# Patient Record
Sex: Female | Born: 1980 | Hispanic: Yes | Marital: Married | Smoking: Never smoker
Health system: Southern US, Community
[De-identification: ages and names within clinical notes are randomized; demographics above are authoritative.]

## PROBLEM LIST (undated history)

## (undated) DIAGNOSIS — E785 Hyperlipidemia, unspecified: Secondary | ICD-10-CM

## (undated) DIAGNOSIS — D219 Benign neoplasm of connective and other soft tissue, unspecified: Secondary | ICD-10-CM

## (undated) DIAGNOSIS — F329 Major depressive disorder, single episode, unspecified: Secondary | ICD-10-CM

## (undated) DIAGNOSIS — F32A Depression, unspecified: Secondary | ICD-10-CM

## (undated) HISTORY — DX: Benign neoplasm of connective and other soft tissue, unspecified: D21.9

## (undated) HISTORY — PX: OTHER SURGICAL HISTORY: SHX169

## (undated) HISTORY — DX: Depression, unspecified: F32.A

## (undated) HISTORY — DX: Hyperlipidemia, unspecified: E78.5

## (undated) HISTORY — DX: Major depressive disorder, single episode, unspecified: F32.9

---

## 1999-12-09 ENCOUNTER — Encounter: Admission: RE | Admit: 1999-12-09 | Discharge: 1999-12-09 | Payer: Self-pay | Admitting: Family Medicine

## 1999-12-28 ENCOUNTER — Encounter: Admission: RE | Admit: 1999-12-28 | Discharge: 1999-12-28 | Payer: Self-pay | Admitting: Family Medicine

## 2000-02-27 ENCOUNTER — Encounter: Admission: RE | Admit: 2000-02-27 | Discharge: 2000-02-27 | Payer: Self-pay | Admitting: Family Medicine

## 2000-03-28 ENCOUNTER — Encounter: Admission: RE | Admit: 2000-03-28 | Discharge: 2000-03-28 | Payer: Self-pay | Admitting: Family Medicine

## 2000-04-13 ENCOUNTER — Encounter: Admission: RE | Admit: 2000-04-13 | Discharge: 2000-04-13 | Payer: Self-pay | Admitting: Family Medicine

## 2000-04-30 ENCOUNTER — Encounter: Admission: RE | Admit: 2000-04-30 | Discharge: 2000-04-30 | Payer: Self-pay | Admitting: Family Medicine

## 2000-05-16 ENCOUNTER — Encounter: Admission: RE | Admit: 2000-05-16 | Discharge: 2000-05-16 | Payer: Self-pay | Admitting: Family Medicine

## 2000-05-30 ENCOUNTER — Encounter: Admission: RE | Admit: 2000-05-30 | Discharge: 2000-05-30 | Payer: Self-pay | Admitting: Family Medicine

## 2000-06-06 ENCOUNTER — Encounter: Admission: RE | Admit: 2000-06-06 | Discharge: 2000-06-06 | Payer: Self-pay | Admitting: Family Medicine

## 2000-06-13 ENCOUNTER — Encounter: Admission: RE | Admit: 2000-06-13 | Discharge: 2000-06-13 | Payer: Self-pay | Admitting: Family Medicine

## 2000-06-18 ENCOUNTER — Encounter: Admission: RE | Admit: 2000-06-18 | Discharge: 2000-06-18 | Payer: Self-pay | Admitting: Sports Medicine

## 2000-06-19 ENCOUNTER — Encounter (HOSPITAL_COMMUNITY): Admission: RE | Admit: 2000-06-19 | Discharge: 2000-06-25 | Payer: Self-pay | Admitting: *Deleted

## 2000-06-21 ENCOUNTER — Inpatient Hospital Stay (HOSPITAL_COMMUNITY): Admission: AD | Admit: 2000-06-21 | Discharge: 2000-06-21 | Payer: Self-pay | Admitting: Obstetrics & Gynecology

## 2000-06-23 ENCOUNTER — Inpatient Hospital Stay (HOSPITAL_COMMUNITY): Admission: AD | Admit: 2000-06-23 | Discharge: 2000-06-23 | Payer: Self-pay | Admitting: *Deleted

## 2000-06-23 ENCOUNTER — Inpatient Hospital Stay (HOSPITAL_COMMUNITY): Admission: AD | Admit: 2000-06-23 | Discharge: 2000-06-26 | Payer: Self-pay | Admitting: *Deleted

## 2002-11-13 ENCOUNTER — Ambulatory Visit (HOSPITAL_COMMUNITY): Admission: RE | Admit: 2002-11-13 | Discharge: 2002-11-13 | Payer: Self-pay | Admitting: Family Medicine

## 2005-07-07 ENCOUNTER — Emergency Department (HOSPITAL_COMMUNITY): Admission: EM | Admit: 2005-07-07 | Discharge: 2005-07-07 | Payer: Self-pay | Admitting: Emergency Medicine

## 2012-08-08 ENCOUNTER — Encounter: Payer: Self-pay | Admitting: Family Medicine

## 2012-08-08 ENCOUNTER — Ambulatory Visit: Payer: No Typology Code available for payment source | Attending: Family Medicine | Admitting: Family Medicine

## 2012-08-08 VITALS — BP 114/72 | HR 67 | Temp 98.3°F | Ht 63.0 in | Wt 174.8 lb

## 2012-08-08 DIAGNOSIS — K089 Disorder of teeth and supporting structures, unspecified: Secondary | ICD-10-CM

## 2012-08-08 DIAGNOSIS — G8929 Other chronic pain: Secondary | ICD-10-CM | POA: Insufficient documentation

## 2012-08-08 DIAGNOSIS — K029 Dental caries, unspecified: Secondary | ICD-10-CM

## 2012-08-08 NOTE — Progress Notes (Signed)
Patient ID: Deanna Robles, female   DOB: 11-10-1980, 32 y.o.   MRN: [id number]  CC:  Need dental referral  Interpreter used to communicate directly with patient for entire encounter including providing detailed patient instructions  HPI: Pt reports that she has not seen a dentist in over 6 years.  She would like a dental referral for evaluaiton.  She does have orange [facility] card.   No Known Allergies No past medical history on file. No current outpatient prescriptions on file prior to visit.   No current facility-administered medications on file prior to visit.   Family History  Problem Relation Age of Onset  . Diabetes Brother    History   Social History  . Marital Status: Married    Spouse Name: N/A    Number of Children: N/A  . Years of Education: N/A   Occupational History  . Not on file.   Social History Main Topics  . Smoking status: Current Every Day Smoker  . Smokeless tobacco: Not on file  . Alcohol Use: Not on file  . Drug Use: Not on file  . Sexually Active: Not on file   Other Topics Concern  . Not on file   Social History Narrative  . No narrative on file    Review of Systems  Constitutional: Negative for fever, chills, diaphoresis, activity change, appetite change and fatigue.  HENT: Negative for ear pain, nosebleeds, congestion, facial swelling, rhinorrhea, neck pain, neck stiffness and ear discharge.   Eyes: Negative for pain, discharge, redness, itching and visual disturbance.  Respiratory: Negative for cough, choking, chest tightness, shortness of breath, wheezing and stridor.   Cardiovascular: Negative for chest pain, palpitations and leg swelling.  Gastrointestinal: Negative for abdominal distention.  Genitourinary: Negative for dysuria, urgency, frequency, hematuria, flank pain, decreased urine volume, difficulty urinating and dyspareunia.  Musculoskeletal: Negative for back pain, joint swelling, arthralgias and gait problem.  Neurological:  Negative for dizziness, tremors, seizures, syncope, facial asymmetry, speech difficulty, weakness, light-headedness, numbness and headaches.  Hematological: Negative for adenopathy. Does not bruise/bleed easily.  Psychiatric/Behavioral: Negative for hallucinations, behavioral problems, confusion, dysphoric mood, decreased concentration and agitation.    Objective:   Filed Vitals:   08/08/12 0907  BP: 114/72  Pulse: 67  Temp: 98.3 F (36.8 C)    Physical Exam  Constitutional: Appears well-developed and well-nourished. No distress.  HENT: Normocephalic. External right and left ear normal. Oropharynx is clear and moist. partial upper dental plates in place.   Eyes: Conjunctivae and EOM are normal. PERRLA, no scleral icterus.  Neck: Normal ROM. Neck supple. No JVD. No tracheal deviation. No thyromegaly.  CVS: RRR, S1/S2 +, no murmurs, no gallops, no carotid bruit.  Pulmonary: Effort and breath sounds normal, no stridor, rhonchi, wheezes, rales.  Abdominal: Soft. BS +,  no distension, tenderness, rebound or guarding.  Musculoskeletal: Normal range of motion. No edema and no tenderness.  Lymphadenopathy: No lymphadenopathy noted, cervical, inguinal. Neuro: Alert. Normal reflexes, muscle tone coordination. No cranial nerve deficit. Skin: Skin is warm and dry. No rash noted. Not diaphoretic. No erythema. No pallor.  Psychiatric: Normal mood and affect. Behavior, judgment, thought content normal.   No results found for this basename: WBC, HGB, HCT, MCV, PLT   No results found for this basename: CREATININE, BUN, NA, K, CL, CO2    No results found for this basename: HGBA1C   Lipid Panel  No results found for this basename: chol, trig, hdl, cholhdl, vldl, ldlcalc     Assessment and  plan:   Patient Active Problem List   Diagnosis Date Noted  . Dental caries 08/08/2012  . Chronic dental pain 08/08/2012    Referral made to dentist for further evaluation  RTC for complete physical  in 6 months  The patient was given clear instructions to go to ER or return to medical center if symptoms don't improve, worsen or new problems develop.  The patient verbalized understanding.  The patient was told to call to get any lab results if not heard anything in the next week.    Rodney Langton, MD, CDE, FAAFP [facility] [place], [place]

## 2012-08-08 NOTE — Patient Instructions (Signed)
Dental Caries   Tooth decay (dental caries, cavities) is the most common of all oral diseases. It occurs in all ages but is more common in children and young adults.   CAUSES   Bacteria in your mouth combine with foods (particularly sugars and starches) to produce plaque. Plaque is a substance that sticks to the hard surfaces of teeth. The bacteria in the plaque produce acids that attack the enamel of teeth. Repeated acid attacks dissolve the enamel and create holes in the teeth. Root surfaces of teeth may also get these holes.   Other contributing factors include:    Frequent snacking and drinking of cavity-producing foods and liquids.   Poor oral hygiene.   Dry mouth.   Substance abuse such as methamphetamine.   Broken or poor fitting dental restorations.   Eating disorders.   Gastroesophageal reflux disease (GERD).   Certain radiation treatments to the head and neck.  SYMPTOMS   At first, dental decay appears as white, chalky areas on the enamel. In this early stage, symptoms are seldom present. As the decay progresses, pits and holes may appear on the enamel surfaces. Progression of the decay will lead to softening of the hard layers of the tooth. At this point you may experience some pain or achy feeling after sweet, hot, or cold foods or drinks are consumed. If left untreated, the decay will reach the internal structures of the tooth and produce severe pain. Extensive dental treatment, such as root canal therapy, may be needed to save the tooth at this late stage of decay development.   DIAGNOSIS   Most cavities will be detected during regular check-ups. A thorough medical and dental history will be taken by the dentist. The dentist will use instruments to check the surfaces of your teeth for any breakdown or discoloration. Some dentists have special instruments, such as lasers, that detect tooth decay. Dental X-rays may also show some cavities that are not visible to the eye (such as between the  contact areas of the teeth).  TREATMENT   Treatment involves removal of the tooth decay and replacement with a restorative material such as silver, gold, or composite (white) material. However, if the decay involves a large area of the tooth and there is little remaining healthy tooth structure, a cap (crown) will be fitted over the remaining structure. If the decay involves the center part of the tooth (pulp), root canal treatment will be needed before any type of dental restoration is placed. If the tooth is severely destroyed by the decay process, leaving the remaining tooth structures unrestorable, the tooth will need to be pulled (extracted). Some early tooth decay may be reversed by fluoride treatments and thorough brushing and flossing at home.  PREVENTION    Eat healthy foods. Restrict the amount of sugary, starchy foods and liquids you consume. Avoid frequent snacking and drinking of unhealthy foods and liquids.   Sealants can help with prevention of cavities. Sealants are composite resins applied onto the biting surfaces of teeth at risk for decay. They smooth out the pits and grooves and prevent food from being trapped in them. This is done in early childhood before tooth decay has started.   Fluoride tablets may also be prescribed to children between 6 months and 10 years of age if your drinking water is not fluoridated. The fluoride absorbed by the tooth enamel makes teeth less susceptible to decay. Thorough daily cleaning with a toothbrush and dental floss is the best way to prevent   cavities. Use of a fluoride toothpaste is highly recommended. Fluoride mouth rinses may be used in specific cases.   Topical application of fluoride by your dentist is important in children.   Regular visits with a dentist for checkups and cleanings are also important.  SEEK IMMEDIATE DENTAL CARE IF:   You have a fever.   You develop redness and swelling of your face, jaw, or neck.   You develop swelling around a  tooth.   You are unable to open your mouth or cannot swallow.   You have severe pain uncontrolled by pain medicine.  Document Released: 09/17/2001 Document Revised: 03/20/2011 Document Reviewed: 06/02/2010  Exit[employer] Patient Information 2014 [employer].

## 2012-10-11 ENCOUNTER — Ambulatory Visit: Payer: No Typology Code available for payment source | Attending: Internal Medicine | Admitting: Internal Medicine

## 2012-10-11 ENCOUNTER — Encounter: Payer: Self-pay | Admitting: Internal Medicine

## 2012-10-11 VITALS — BP 116/77 | HR 70 | Temp 98.7°F | Resp 16 | Ht 63.39 in | Wt 175.0 lb

## 2012-10-11 DIAGNOSIS — K219 Gastro-esophageal reflux disease without esophagitis: Secondary | ICD-10-CM | POA: Insufficient documentation

## 2012-10-11 DIAGNOSIS — Z0000 Encounter for general adult medical examination without abnormal findings: Secondary | ICD-10-CM

## 2012-10-11 DIAGNOSIS — Z23 Encounter for immunization: Secondary | ICD-10-CM

## 2012-10-11 LAB — CBC WITH DIFFERENTIAL/PLATELET
Basophils Absolute: 0 10*3/uL (ref 0.0–0.1)
Basophils Relative: 0 % (ref 0–1)
Eosinophils Absolute: 0.1 10*3/uL (ref 0.0–0.7)
Eosinophils Relative: 1 % (ref 0–5)
HCT: 41.8 % (ref 36.0–46.0)
Hemoglobin: 14.4 g/dL (ref 12.0–15.0)
Lymphocytes Relative: 29 % (ref 12–46)
Lymphs Abs: 2 10*3/uL (ref 0.7–4.0)
MCH: 30.4 pg (ref 26.0–34.0)
MCHC: 34.4 g/dL (ref 30.0–36.0)
MCV: 88.4 fL (ref 78.0–100.0)
Monocytes Absolute: 0.5 10*3/uL (ref 0.1–1.0)
Monocytes Relative: 7 % (ref 3–12)
Neutro Abs: 4.3 10*3/uL (ref 1.7–7.7)
Neutrophils Relative %: 63 % (ref 43–77)
Platelets: 349 10*3/uL (ref 150–400)
RBC: 4.73 MIL/uL (ref 3.87–5.11)
RDW: 12.8 % (ref 11.5–15.5)
WBC: 6.8 10*3/uL (ref 4.0–10.5)

## 2012-10-11 LAB — COMPREHENSIVE METABOLIC PANEL
ALT: 23 U/L (ref 0–35)
AST: 16 U/L (ref 0–37)
Albumin: 4.5 g/dL (ref 3.5–5.2)
Alkaline Phosphatase: 74 U/L (ref 39–117)
BUN: 10 mg/dL (ref 6–23)
CO2: 27 mEq/L (ref 19–32)
Calcium: 9.3 mg/dL (ref 8.4–10.5)
Chloride: 102 mEq/L (ref 96–112)
Creat: 0.54 mg/dL (ref 0.50–1.10)
Glucose, Bld: 83 mg/dL (ref 70–99)
Potassium: 4.1 mEq/L (ref 3.5–5.3)
Sodium: 137 mEq/L (ref 135–145)
Total Bilirubin: 0.4 mg/dL (ref 0.3–1.2)
Total Protein: 7.5 g/dL (ref 6.0–8.3)

## 2012-10-11 LAB — LIPID PANEL
Cholesterol: 206 mg/dL — ABNORMAL HIGH (ref 0–200)
HDL: 44 mg/dL (ref >39)
LDL Cholesterol: 138 mg/dL — ABNORMAL HIGH (ref 0–99)
Total CHOL/HDL Ratio: 4.7 Ratio
Triglycerides: 119 mg/dL (ref <150)
VLDL: 24 mg/dL (ref 0–40)

## 2012-10-11 LAB — HEMOGLOBIN A1C
Hgb A1c MFr Bld: 5.6 % (ref <5.7)
Mean Plasma Glucose: 114 mg/dL (ref <117)

## 2012-10-11 MED ORDER — OMEPRAZOLE 20 MG PO CPDR: 20.0000 mg | DELAYED_RELEASE_CAPSULE | Freq: Every day | ORAL | Status: DC

## 2012-10-11 NOTE — Progress Notes (Signed)
Patient ID: Deanna Robles, female   DOB: 1980/03/16, 32 y.o.   MRN: [id number] Patient Demographics  Deanna Robles, is a 32 y.o. female  [id number]  [id number]  DOB - 1980/08/21  Chief Complaint  Patient presents with  . Establish Care        Subjective:   Deanna Robles today is here to establish primary care. History obtained through the interpreter line on the phone. Patient is a 32 year old female with no significant past medical history presented to the clinic to establish care and for physical. Patient reports that she has noticed occasional heartburn pains and wanted to be checked out. She also reports that her mother has diabetes and and wants to be checked out for the labs and rule out diabetes. She states that she has gained weight and trying different diets but they do not work.  Patient has No headache, No chest pain, No abdominal pain - No Nausea, No new weakness tingling or numbness, No Cough - SOB  Objective:    Filed Vitals:   10/11/12 1046  BP: 116/77  Pulse: 70  Temp: 98.7 F (37.1 C)  TempSrc: Oral  Resp: 16  Height: 5' 3.39" (1.61 m)  Weight: 175 lb (79.379 kg)  SpO2: 98%     ALLERGIES:  No Known Allergies  PAST MEDICAL HISTORY: No past medical history on file.  PAST SURGICAL HISTORY: No past surgical history on file.  FAMILY HISTORY: Family History  Problem Relation Age of Onset  . Diabetes Brother     MEDICATIONS AT HOME: Prior to Admission medications   Medication Sig Start Date End Date Taking? Authorizing Provider  omeprazole (PRILOSEC) 20 MG capsule Take 1 capsule (20 mg total) by mouth daily. 10/11/12   Ripudeep Jenna Luo, MD    REVIEW OF SYSTEMS:  Constitutional:   No   Fevers, chills, fatigue.  HEENT:    No headaches, Sore throat,   Cardio-vascular: No chest pain,  Orthopnea, swelling in lower extremities, anasarca, palpitations  GI:  No abdominal pain, nausea, vomiting, diarrhea  Resp: No shortness of  breath,  No coughing up of blood.No cough.No wheezing.  Skin:  no rash or lesions.  GU:  no dysuria, change in color of urine, no urgency or frequency.  No flank pain.  Musculoskeletal: No joint pain or swelling.  No decreased range of motion.  No back pain.  Psych: No change in mood or affect. No depression or anxiety.  No memory loss.   Exam  General appearance :Awake, alert, NAD, Speech Clear. HEENT: Atraumatic and Normocephalic, PERLA Neck: supple, no JVD. No cervical lymphadenopathy.  Chest: clear to auscultation bilaterally, no wheezing, rales or rhonchi CVS: S1 S2 regular, no murmurs.  Abdomen: soft, NBS, NT, ND, no gaurding, rigidity or rebound. Extremities: No cyanosis, clubbing, B/L Lower Ext shows no edema,  Neurology: Awake alert, and oriented X 3, CN II-XII intact, Non focal Skin:No Rash or lesions Wounds: N/A    Data Review   Basic Metabolic Panel: No results found for this basename: NA, K, CL, CO2, GLUCOSE, BUN, CREATININE, CALCIUM, MG, PHOS,  in the last 168 hours Liver Function Tests: No results found for this basename: AST, ALT, ALKPHOS, BILITOT, PROT, ALBUMIN,  in the last 168 hours  CBC: No results found for this basename: WBC, NEUTROABS, HGB, HCT, MCV, PLT,  in the last 168 hours ------------------------------------------------------------------------------------------------------------------ No results found for this basename: HGBA1C,  in the last 72 hours ------------------------------------------------------------------------------------------------------------------ No results found for this basename: CHOL, HDL, LDLCALC,  TRIG, CHOLHDL, LDLDIRECT,  in the last 72 hours ------------------------------------------------------------------------------------------------------------------ No results found for this basename: TSH, T4TOTAL, FREET3, T3FREE, THYROIDAB,  in the last 72  hours ------------------------------------------------------------------------------------------------------------------ No results found for this basename: VITAMINB12, FOLATE, FERRITIN, TIBC, IRON, RETICCTPCT,  in the last 72 hours  Coagulation profile  No results found for this basename: INR, PROTIME,  in the last 168 hours    Assessment & Plan   Active Problems: 1. GERD: - Placed on omeprazole 40 mg daily  2. health screening -Ordered CBC, CMET, Hemoglobin A1c, lipid panel - Rule out any hypothyroidism, ordered TSH, (patient reports weight gain.)  - She had a Pap smear last year and follows [facility] herself   Recommendations: Follow labs in 2 months, will call patient with lab results and if anything abnormal  Follow-up in 2 months   RAI,RIPUDEEP M.D. 10/11/2012, 11:06 AM

## 2012-10-11 NOTE — Patient Instructions (Signed)
Pt is here for a F/U visit Pt is here to review her lab results  

## 2012-10-12 LAB — TSH: TSH: 1.168 u[IU]/mL (ref 0.350–4.500)

## 2012-12-12 ENCOUNTER — Ambulatory Visit: Payer: No Typology Code available for payment source | Admitting: Internal Medicine

## 2013-09-16 ENCOUNTER — Encounter: Payer: Self-pay | Admitting: Internal Medicine

## 2013-09-16 ENCOUNTER — Other Ambulatory Visit (HOSPITAL_COMMUNITY)
Admission: RE | Admit: 2013-09-16 | Discharge: 2013-09-16 | Disposition: A | Discharge status: Home / Self Care | Payer: No Typology Code available for payment source | Source: Ambulatory Visit | Attending: Internal Medicine | Admitting: Internal Medicine

## 2013-09-16 ENCOUNTER — Ambulatory Visit: Payer: No Typology Code available for payment source | Attending: Internal Medicine | Admitting: Internal Medicine

## 2013-09-16 VITALS — BP 119/79 | HR 72 | Temp 98.7°F | Resp 16 | Ht 63.0 in | Wt 185.0 lb

## 2013-09-16 DIAGNOSIS — N949 Unspecified condition associated with female genital organs and menstrual cycle: Secondary | ICD-10-CM | POA: Insufficient documentation

## 2013-09-16 DIAGNOSIS — Z01419 Encounter for gynecological examination (general) (routine) without abnormal findings: Secondary | ICD-10-CM | POA: Insufficient documentation

## 2013-09-16 DIAGNOSIS — N760 Acute vaginitis: Secondary | ICD-10-CM | POA: Insufficient documentation

## 2013-09-16 DIAGNOSIS — R102 Pelvic and perineal pain: Secondary | ICD-10-CM

## 2013-09-16 DIAGNOSIS — F3289 Other specified depressive episodes: Secondary | ICD-10-CM | POA: Insufficient documentation

## 2013-09-16 DIAGNOSIS — F32A Depression, unspecified: Secondary | ICD-10-CM

## 2013-09-16 DIAGNOSIS — F329 Major depressive disorder, single episode, unspecified: Secondary | ICD-10-CM | POA: Insufficient documentation

## 2013-09-16 DIAGNOSIS — Z0000 Encounter for general adult medical examination without abnormal findings: Secondary | ICD-10-CM | POA: Insufficient documentation

## 2013-09-16 DIAGNOSIS — Z1151 Encounter for screening for human papillomavirus (HPV): Secondary | ICD-10-CM | POA: Insufficient documentation

## 2013-09-16 DIAGNOSIS — Z113 Encounter for screening for infections with a predominantly sexual mode of transmission: Secondary | ICD-10-CM | POA: Insufficient documentation

## 2013-09-16 LAB — POCT URINALYSIS DIPSTICK
Bilirubin, UA: NEGATIVE
Blood, UA: NEGATIVE
Glucose, UA: NEGATIVE
Ketones, UA: NEGATIVE
Leukocytes, UA: NEGATIVE
Nitrite, UA: NEGATIVE
Protein, UA: NEGATIVE
Spec Grav, UA: 1.025
Urobilinogen, UA: 0.2
pH, UA: 5.5

## 2013-09-16 MED ORDER — ESCITALOPRAM OXALATE 10 MG PO TABS: 10.0000 mg | ORAL_TABLET | Freq: Every day | ORAL | Status: DC

## 2013-09-16 NOTE — Progress Notes (Signed)
Pt is here for a pap smear and a physical. Pt reports having pain in her lower left abdomen. "ovary pain"

## 2013-09-16 NOTE — Progress Notes (Signed)
Patient ID: Deanna Robles, female   DOB: 10/16/1980, 33 y.o.   MRN: [id number]  [id number]:[id number]  [id number]  DOB - [birth year]  CC:  Chief Complaint  Patient presents with  . Establish Care       HPI: Deanna Robles is a 32 y.o. female here today to establish medical care.  Patient presents to clinic today for c/o left pelvic pain for 3.5 months ago.  Feels as if someone is "hitting her in the stomach" and crampy.  LMP was 8/18.  Has irregular menstrual cycles.  Has white vaginal discharge. Patient reports that she has been having headaches and anxiety.  As a result of her anxiety she reports that she is always hungry. Has pain on left breast pain with palpitation for years.     No Known Allergies History reviewed. No pertinent past medical history. Current Outpatient Prescriptions on File Prior to Visit  Medication Sig Dispense Refill  . omeprazole (PRILOSEC) 20 MG capsule Take 1 capsule (20 mg total) by mouth daily.  30 capsule  3   No current facility-administered medications on file prior to visit.   Family History  Problem Relation Age of Onset  . Diabetes Brother    History   Social History  . Marital Status: Married    Spouse Name: N/A    Number of Children: N/A  . Years of Education: N/A   Occupational History  . Not on file.   Social History Main Topics  . Smoking status: Never Smoker   . Smokeless tobacco: Not on file  . Alcohol Use: Yes     Comment: occasionally  . Drug Use: No  . Sexual Activity: Not on file   Other Topics Concern  . Not on file   Social History Narrative  . No narrative on file   Review of Systems  Constitutional: Positive for malaise/fatigue. Negative for fever and chills.  HENT: Negative.   Eyes: Negative.   Respiratory: Negative.   Cardiovascular: Negative.   Gastrointestinal: Negative.   Genitourinary: Negative.   Musculoskeletal: Negative.   Neurological: Positive for tingling and weakness. Negative for dizziness.    Endo/Heme/Allergies: Negative.   Psychiatric/Behavioral: Positive for depression. Negative for substance abuse. The patient is nervous/anxious.       Objective:   Filed Vitals:   09/16/13 1420  BP: 119/79  Pulse: 72  Temp: 98.7 F (37.1 C)  Resp: 16   Physical Exam  Constitutional: She is oriented to person, place, and time.  HENT:  Right Ear: External ear normal.  Left Ear: External ear normal.  Mouth/Throat: Oropharynx is clear and moist.  Eyes: Conjunctivae and EOM are normal. Pupils are equal, round, and reactive to light.  Neck: Normal range of motion. Neck supple.  Cardiovascular: Normal rate, regular rhythm and normal heart sounds.   Pulmonary/Chest: Effort normal and breath sounds normal. Right breast exhibits tenderness. Left breast exhibits tenderness.  Abdominal: Soft. Bowel sounds are normal. Distention: pelvic. There is tenderness.  Genitourinary: Vagina normal and uterus normal. Cervix exhibits friability. Cervix exhibits no motion tenderness and no discharge. Left adnexum displays tenderness.  Musculoskeletal: Normal range of motion. She exhibits no edema and no tenderness.  Neurological: She is alert and oriented to person, place, and time. She has normal reflexes.  Skin: Skin is warm and dry.  Psychiatric: She has a normal mood and affect. Thought content normal.     Lab Results  Component Value Date   WBC 6.8 10/11/2012   HGB 14.4 10/11/2012  HCT 41.8 10/11/2012   MCV 88.4 10/11/2012   PLT 349 10/11/2012   Lab Results  Component Value Date   CREATININE 0.54 10/11/2012   BUN 10 10/11/2012   NA 137 10/11/2012   K 4.1 10/11/2012   CL 102 10/11/2012   CO2 27 10/11/2012    Lab Results  Component Value Date   HGBA1C 5.6 10/11/2012   Lipid Panel     Component Value Date/Time   CHOL 206* 10/11/2012 1120   TRIG 119 10/11/2012 1120   HDL 44 10/11/2012 1120   CHOLHDL 4.7 10/11/2012 1120   VLDL 24 10/11/2012 1120   LDLCALC 138* 10/11/2012 1120        Assessment and plan:   Deanna Robles was seen today for establish care.  Diagnoses and associated orders for this visit:  Preventative health care - Urinalysis Dipstick Discussed caffeine use and how it may cause tenderness. Patient continue to monitor breast tenderness and see if it improves after cycle  Annual physical exam - Cytology - PAP Deanna Robles - Cervicovaginal ancillary only - CBC with Differential - COMPLETE METABOLIC PANEL WITH GFR - TSH - Hemoglobin A1C - Lipid panel; Future  Pelvic pain in female - US Transvaginal Non-OB; Future  Depression - escitalopram (LEXAPRO) 10 MG tablet; Take 1 tablet (10 mg total) by mouth daily.   Return in about 6 weeks (around 10/28/2013), or if symptoms worsen or fail to improve, for depression.  The patient was given clear instructions to go to ER or return to medical center if symptoms don't improve, worsen or new problems develop. The patient verbalized understanding.  Chari Manning, NP-C [facility] [phone] 09/16/2013, 2:46 PM

## 2013-09-17 LAB — CBC WITH DIFFERENTIAL/PLATELET
Basophils Absolute: 0 10*3/uL (ref 0.0–0.1)
Basophils Relative: 0 % (ref 0–1)
Eosinophils Absolute: 0.2 10*3/uL (ref 0.0–0.7)
Eosinophils Relative: 2 % (ref 0–5)
HCT: 39.6 % (ref 36.0–46.0)
Hemoglobin: 13.7 g/dL (ref 12.0–15.0)
Lymphocytes Relative: 26 % (ref 12–46)
Lymphs Abs: 2.2 10*3/uL (ref 0.7–4.0)
MCH: 30.2 pg (ref 26.0–34.0)
MCHC: 34.6 g/dL (ref 30.0–36.0)
MCV: 87.2 fL (ref 78.0–100.0)
Monocytes Absolute: 0.5 10*3/uL (ref 0.1–1.0)
Monocytes Relative: 6 % (ref 3–12)
Neutro Abs: 5.6 10*3/uL (ref 1.7–7.7)
Neutrophils Relative %: 66 % (ref 43–77)
Platelets: 331 10*3/uL (ref 150–400)
RBC: 4.54 MIL/uL (ref 3.87–5.11)
RDW: 13.1 % (ref 11.5–15.5)
WBC: 8.5 10*3/uL (ref 4.0–10.5)

## 2013-09-17 LAB — COMPLETE METABOLIC PANEL WITH GFR
ALT: 17 U/L (ref 0–35)
AST: 11 U/L (ref 0–37)
Albumin: 4.3 g/dL (ref 3.5–5.2)
Alkaline Phosphatase: 65 U/L (ref 39–117)
BUN: 14 mg/dL (ref 6–23)
CO2: 25 mEq/L (ref 19–32)
Calcium: 8.9 mg/dL (ref 8.4–10.5)
Chloride: 104 mEq/L (ref 96–112)
Creat: 0.52 mg/dL (ref 0.50–1.10)
GFR, Est African American: >89 mL/min
GFR, Est Non African American: >89 mL/min
Glucose, Bld: 100 mg/dL — ABNORMAL HIGH (ref 70–99)
Potassium: 3.7 mEq/L (ref 3.5–5.3)
Sodium: 137 mEq/L (ref 135–145)
Total Bilirubin: 0.2 mg/dL (ref 0.2–1.2)
Total Protein: 7 g/dL (ref 6.0–8.3)

## 2013-09-17 LAB — HEMOGLOBIN A1C
Hgb A1c MFr Bld: 5.7 % — ABNORMAL HIGH (ref <5.7)
Mean Plasma Glucose: 117 mg/dL — ABNORMAL HIGH (ref <117)

## 2013-09-17 LAB — TSH: TSH: 1.319 u[IU]/mL (ref 0.350–4.500)

## 2013-09-18 LAB — CYTOLOGY - PAP

## 2013-09-19 ENCOUNTER — Other Ambulatory Visit: Payer: Self-pay | Admitting: Internal Medicine

## 2013-09-19 ENCOUNTER — Ambulatory Visit (HOSPITAL_COMMUNITY)
Admission: RE | Admit: 2013-09-19 | Discharge: 2013-09-19 | Disposition: A | Discharge status: Home / Self Care | Payer: No Typology Code available for payment source | Source: Ambulatory Visit | Attending: Internal Medicine | Admitting: Internal Medicine

## 2013-09-19 DIAGNOSIS — D259 Leiomyoma of uterus, unspecified: Secondary | ICD-10-CM | POA: Insufficient documentation

## 2013-09-19 DIAGNOSIS — N949 Unspecified condition associated with female genital organs and menstrual cycle: Secondary | ICD-10-CM | POA: Insufficient documentation

## 2013-09-19 DIAGNOSIS — R102 Pelvic and perineal pain: Secondary | ICD-10-CM

## 2013-09-24 ENCOUNTER — Telehealth: Payer: Self-pay | Admitting: *Deleted

## 2013-09-24 NOTE — Telephone Encounter (Signed)
Message copied by Betti Cruz on Wed Sep 24, 2013  4:15 PM ------      Message from: Chari Manning A      Created: Fri Sep 19, 2013  4:08 PM       Pap negative for malignancies ------

## 2013-09-24 NOTE — Telephone Encounter (Signed)
Pt aware of Pap smear result

## 2013-09-25 ENCOUNTER — Telehealth: Payer: Self-pay

## 2013-09-25 DIAGNOSIS — D259 Leiomyoma of uterus, unspecified: Secondary | ICD-10-CM

## 2013-09-25 NOTE — Telephone Encounter (Signed)
Message copied by Dorothe Pea on Thu Sep 25, 2013  3:17 PM ------      Message from: Chari Manning A      Created: Thu Sep 18, 2013  7:53 AM       Labs are normal. Still waiting on pap results ------

## 2013-09-25 NOTE — Telephone Encounter (Signed)
Interpreter used  Patient is aware of her lab results and [name] Referral to [facility] placed in Epic

## 2013-09-30 ENCOUNTER — Encounter: Payer: Self-pay | Admitting: Obstetrics & Gynecology

## 2013-10-20 ENCOUNTER — Ambulatory Visit: Payer: No Typology Code available for payment source | Admitting: Internal Medicine

## 2013-10-28 ENCOUNTER — Ambulatory Visit: Payer: No Typology Code available for payment source

## 2013-11-04 ENCOUNTER — Encounter: Payer: Self-pay | Admitting: General Practice

## 2013-11-13 ENCOUNTER — Encounter: Payer: Self-pay | Admitting: Obstetrics & Gynecology

## 2013-11-13 ENCOUNTER — Ambulatory Visit (INDEPENDENT_AMBULATORY_CARE_PROVIDER_SITE_OTHER): Payer: Self-pay | Admitting: Obstetrics & Gynecology

## 2013-11-13 VITALS — BP 120/64 | HR 64 | Temp 98.0°F | Ht 62.75 in | Wt 182.2 lb

## 2013-11-13 DIAGNOSIS — R102 Pelvic and perineal pain: Secondary | ICD-10-CM

## 2013-11-13 MED ORDER — NORGESTIMATE-ETH ESTRADIOL 0.25-35 MG-MCG PO TABS: 1.0000 | ORAL_TABLET | Freq: Every day | ORAL | Status: DC

## 2013-11-13 NOTE — Progress Notes (Signed)
Subjective:     Patient ID: Deanna Robles, female   DOB: December 08, 1980, 33 y.o.   MRN: [id number]  HPI 33 yo Hispanic female G3P2012 LMP 11/13/2013 presents for 'evaluation of fibroids'. Pt c/o 2 years of pain on the left side and sometimes on the right side.  Sx occur with intercourse as well.  Has stools daily.  Does not feel that it is related to the bowel function.  Pain occurs daily.  The pain is relieved with NSAIDS.  The pain does get worse with her menses.  She is on her menses currently.  She reports menses q 3-4 weeks but, reports that her cycles are irregular.  Changes pads 3-4 times per day and uses the overnight pads.   Past Medical History  Diagnosis Date  . Depression   . Fibroids   History reviewed. No pertinent past surgical history.  Current Outpatient Prescriptions on File Prior to Visit  Medication Sig Dispense Refill  . escitalopram (LEXAPRO) 10 MG tablet Take 1 tablet (10 mg total) by mouth daily. 30 tablet 1  . omeprazole (PRILOSEC) 20 MG capsule Take 1 capsule (20 mg total) by mouth daily. 30 capsule 3   No current facility-administered medications on file prior to visit.    History   Social History  . Marital Status: Married    Spouse Name: N/A    Number of Children: N/A  . Years of Education: N/A   Occupational History  . Not on file.   Social History Main Topics  . Smoking status: Never Smoker   . Smokeless tobacco: Never Used  . Alcohol Use: Yes     Comment: occasionally  . Drug Use: No  . Sexual Activity: Yes    Birth Control/ Protection: None   Other Topics Concern  . Not on file   Social History Narrative      Review of Systems     Objective:   Physical Exam BP 120/64 mmHg  Pulse 64  Temp(Src) 98 F (36.7 C)  Ht 5' 2.75" (1.594 m)  Wt 182 lb 3.2 oz (82.645 kg)  BMI 32.53 kg/m2 Pt in NAD Lungs: CTA CV: RRR Abd; soft, ND.  Tenderness to palpation in low quads.  No rebound or guarding GU: EGBUS: no lesions Vagina: blood in  vault Cervix: no lesion; no mucopurulent d/c; NO CMT Uterus: small, mobile Adnexa: no masses; tender bilaterally      09/19/2013  EXAM: TRANSVAGINAL ULTRASOUND OF PELVIS; US PELVIS COMPLETE  TECHNIQUE: Transvaginal ultrasound examination of the pelvis was performed including evaluation of the uterus, ovaries, adnexal regions, and pelvic cul-de-sac.  COMPARISON: None.  FINDINGS: Uterus  Measurements: 8.4 x 4.6 x 5.4 cm. 1.6 x 1.7 x 1.5 cm intramural fibroid anterior fundus. Nabothian cysts.  Endometrium  Thickness: 8 mm. No focal abnormality visualized.  Right ovary  Measurements: 3.0 x 1.5 x 2.0 cm. Normal appearance/no adnexal mass.  Left ovary  Measurements: 2.6 x 1 point bowel 0.7 cm. Normal appearance/no adnexal mass.  Other findings: No free fluid  IMPRESSION: 1.7 cm intramural fibroid anterior fundus. Exam otherwise unremarkable.     Assessment:     Pelvic pain worse with menses- possibly endometriosis     Plan:     Sprintec 1 po q day Motrin prn F/u in 3 months or sooner prn

## 2013-11-13 NOTE — Progress Notes (Signed)
Used Interpreter Avelino Leeds.Here for fibroids and pelvic pain.  Is on lexapro from [facility] for 2 months. Has follow up appointment there Monday. Given emergency numbers for mental health.

## 2013-11-13 NOTE — Patient Instructions (Signed)
Endometriosis (Endometriosis) La endometriosis es una enfermedad en la que el tejido que rodea al tero (endometrio) crece fuera de su ubicacin normal. El tejido puede crecer en muchos lugares cerca del tero, pero comnmente crece en los ovarios, las trompas de Falopio, la vagina o el intestino. Dado que el tero expulsa o desprende su revestimiento en cada ciclo menstrual, hay sangrado en el lugar donde se localiza el tejido endometrial. Esto puede causar dolor porque la sangre es irritante para los tejidos que no estn normalmente expuestos a Librarian, academic.  CAUSAS  Se desconoce la causa de la endometriosis.  SIGNOS Y SNTOMAS  A menudo, no hay sntomas. Cuando se presentan sntomas, estos pueden variar segn la ubicacin del tejido desplazado. Pueden ocurrir diversos sntomas en diferentes momentos. Aunque los sntomas se producen principalmente durante el perodo menstrual de [place], tambin pueden aparecer en la mitad del [place], y generalmente terminan con la menopausia. Algunas personas pueden pasar meses sin experimentar ningn tipo de sntomas. Los sntomas pueden incluir:   Dolor abdominal o en la espalda.  Sangrado ms abundante durante los perodos [place].  [place].  Dolor al defecar.  Infertilidad. DIAGNSTICO  El mdico le preguntar acerca de sus sntomas y le har un examen fsico. Se pueden realizar varios estudios, por ejemplo:   Anlisis de [place] y [place]. Estos se realizan para ayudar a Sport and exercise psychologist.  Ecografas. Este estudio se realiza para observar el tejido anormal.  Radiografa del recto (enema de bario).  Laparoscopia. En este procedimiento, se introduce en el abdomen un tubo delgado, que emite luz y tiene una pequea cmara en el extremo (laparoscopio). Esto ayuda a que su mdico observe el tejido anormal para confirmar el diagnstico. El mdico tambin puede tomar una pequea [place] de tejido anormal (biopsia) que  encuentra. Posteriormente esta muestra de tejido se enva a un laboratorio para examinarlo con un microscopio. [place] y puede incluir lo siguiente:   Medicamentos para Best boy. Los antiinflamatorios no esteroides Dayna Ramus) son un tipo de analgsico que pueden ayudar a Best boy causado por la endometriosis.  Terapia hormonal. Cuando se use la terapia hormonal, se eliminan los perodos menstruales. Esto elimina la exposicin mensual a la sangre por el tejido endometrial desplazado.  Ciruga. Algunas veces puede hacerse una ciruga para extirpar el tejido endometrial anormal. Cuando los casos son graves, puede realizarse una ciruga para extirpar las trompas de [place], el tero y los ovarios (histerectoma). [place] todos los [employer] se lo haya indicado el mdico. No tome aspirina porque este medicamento puede aumentar el sangrado cuando no recibe terapia hormonal.  Evite actividades que produzcan dolor, incluida la actividad sexual. SOLICITE ATENCIN MDICA SI:  Tiene dolor plvico durante los perodos menstruales y antes y despus de [place].  Siente dolor plvico [employer] perodos menstruales que empeora durante el perodo.  Experimenta dolor plvico durante la actividad sexual o despus de [place].  Siente dolor plvico al defecar u orinar, especialmente durante el perodo menstrual.  Tiene dificultad para quedar embarazada.  Tiene fiebre. SOLICITE ATENCIN MDICA DE INMEDIATO SI:   El dolor es intenso y no responde a los analgsicos.  Siente nuseas y vmitos intensos, o no puede [employer].  Tiene dolor que se limita a la parte inferior derecha del abdomen.  Presenta hinchazn o aumento del dolor en el abdomen.  Lollie Marrow en la materia fecal. ASEGRESE DE QUE:  Comprende estas instrucciones.  Controlar su afeccin.  Recibir ayuda de inmediato si no mejora o si  empeora. Document Released: 12/26/2004 Document Revised: 05/12/2013 [facility] Patient Information 2015 [place], [place]. This information is not intended to replace advice given to you by your health care provider. Make sure you discuss any questions you have with your health care provider.

## 2013-11-17 ENCOUNTER — Ambulatory Visit: Payer: No Typology Code available for payment source | Admitting: Internal Medicine

## 2013-11-24 ENCOUNTER — Ambulatory Visit (HOSPITAL_BASED_OUTPATIENT_CLINIC_OR_DEPARTMENT_OTHER): Payer: No Typology Code available for payment source | Admitting: *Deleted

## 2013-11-24 ENCOUNTER — Ambulatory Visit: Payer: No Typology Code available for payment source | Attending: Internal Medicine | Admitting: Internal Medicine

## 2013-11-24 ENCOUNTER — Encounter: Payer: Self-pay | Admitting: Internal Medicine

## 2013-11-24 VITALS — BP 118/79 | HR 73 | Temp 97.5°F | Resp 16 | Ht 63.0 in | Wt 182.0 lb

## 2013-11-24 DIAGNOSIS — M549 Dorsalgia, unspecified: Secondary | ICD-10-CM | POA: Insufficient documentation

## 2013-11-24 DIAGNOSIS — F329 Major depressive disorder, single episode, unspecified: Secondary | ICD-10-CM | POA: Insufficient documentation

## 2013-11-24 DIAGNOSIS — D259 Leiomyoma of uterus, unspecified: Secondary | ICD-10-CM | POA: Insufficient documentation

## 2013-11-24 DIAGNOSIS — F32A Depression, unspecified: Secondary | ICD-10-CM

## 2013-11-24 DIAGNOSIS — Z79899 Other long term (current) drug therapy: Secondary | ICD-10-CM | POA: Insufficient documentation

## 2013-11-24 DIAGNOSIS — Z23 Encounter for immunization: Secondary | ICD-10-CM | POA: Insufficient documentation

## 2013-11-24 DIAGNOSIS — R102 Pelvic and perineal pain: Secondary | ICD-10-CM | POA: Insufficient documentation

## 2013-11-24 LAB — POCT URINALYSIS DIPSTICK
Bilirubin, UA: NEGATIVE
Blood, UA: NEGATIVE
Glucose, UA: NEGATIVE
Ketones, UA: NEGATIVE
Leukocytes, UA: NEGATIVE
Nitrite, UA: NEGATIVE
Protein, UA: NEGATIVE
Spec Grav, UA: 1.015
Urobilinogen, UA: 0.2
pH, UA: 6.5

## 2013-11-24 MED ORDER — TRAMADOL HCL 50 MG PO TABS: 50.0000 mg | ORAL_TABLET | Freq: Three times a day (TID) | ORAL | Status: DC | PRN

## 2013-11-24 MED ORDER — ESCITALOPRAM OXALATE 20 MG PO TABS: 20.0000 mg | ORAL_TABLET | Freq: Every day | ORAL | Status: DC

## 2013-11-24 NOTE — Progress Notes (Signed)
Patient ID: Deanna Robles, female   DOB: 10/29/1980, 33 y.o.   MRN: [id number]  CC: abdominal, back pian  HPI:  Patient presents to clinic today for evaluation of continued back and pelvic pain.  Patient reports dyspareunia upon my last visit with her, and was referred to GYN.  She was recently seen by GYN 11 days ago and was told that she may have endometriosis due to her fibroids and symptoms.  She was started on OCP (springtec) daily to help with symptoms.  Today she reports that the ibuprofen is not helping the pain anymore and notices that the back pain is different and more severe since beginning the OCP.   She is requesting a refill of her Lexapro.  She has been out of Lexapro for one week.  She has noticed decrease in agitation and frustration since beginning Lexapro.  She does note that she still feels depressed often due to the constant pelvic pain and not being able to figure out what is wrong with her medically.  She denies SI.  No Known Allergies Past Medical History  Diagnosis Date  . Depression   . Fibroids    Current Outpatient Prescriptions on File Prior to Visit  Medication Sig Dispense Refill  . norgestimate-ethinyl estradiol (ORTHO-CYCLEN,SPRINTEC,PREVIFEM) 0.25-35 MG-MCG tablet Take 1 tablet by mouth daily. 1 Package 11  . escitalopram (LEXAPRO) 10 MG tablet Take 1 tablet (10 mg total) by mouth daily. 30 tablet 1  . ibuprofen (ADVIL,MOTRIN) 800 MG tablet 800 mg every 6 (six) hours as needed.   1  . omeprazole (PRILOSEC) 20 MG capsule Take 1 capsule (20 mg total) by mouth daily. 30 capsule 3   No current facility-administered medications on file prior to visit.   Family History  Problem Relation Age of Onset  . Diabetes Brother   . Hypertension Mother   . Diabetes Mother   . Hyperlipidemia Mother   . Diabetes Father   . Hyperlipidemia Father   . Hypertension Father    History   Social History  . Marital Status: Married    Spouse Name: N/A    Number of  Children: N/A  . Years of Education: N/A   Occupational History  . Not on file.   Social History Main Topics  . Smoking status: Never Smoker   . Smokeless tobacco: Never Used  . Alcohol Use: Yes     Comment: occasionally  . Drug Use: No  . Sexual Activity: Yes    Birth Control/ Protection: None   Other Topics Concern  . Not on file   Social History Narrative    Review of Systems: See HPI   Objective:   Filed Vitals:   11/24/13 1030  BP: 118/79  Pulse: 73  Temp: 97.5 F (36.4 C)  Resp: 16    Physical Exam  Constitutional: She is oriented to person, place, and time.  Cardiovascular: Normal rate, regular rhythm and normal heart sounds.   Pulmonary/Chest: Effort normal and breath sounds normal.  Abdominal: Soft. Bowel sounds are normal. She exhibits no mass. There is tenderness (Bilateral lower quadrants).  Neurological: She is alert and oriented to person, place, and time.  Skin: Skin is warm and dry.  Psychiatric: She has a normal mood and affect.     Lab Results  Component Value Date   WBC 8.5 09/16/2013   HGB 13.7 09/16/2013   HCT 39.6 09/16/2013   MCV 87.2 09/16/2013   PLT 331 09/16/2013   Lab Results  Component Value  Date   CREATININE 0.52 09/16/2013   BUN 14 09/16/2013   NA 137 09/16/2013   K 3.7 09/16/2013   CL 104 09/16/2013   CO2 25 09/16/2013    Lab Results  Component Value Date   HGBA1C 5.7* 09/16/2013   Lipid Panel     Component Value Date/Time   CHOL 206* 10/11/2012 1120   TRIG 119 10/11/2012 1120   HDL 44 10/11/2012 1120   CHOLHDL 4.7 10/11/2012 1120   VLDL 24 10/11/2012 1120   LDLCALC 138* 10/11/2012 1120       Assessment and plan:   Benedetta was seen today for follow-up.  Diagnoses and associated orders for this visit:  Pelvic pain in female - Urinalysis Dipstick--negative - traMADol (ULTRAM) 50 MG tablet; Take 1 tablet (50 mg total) by mouth every 8 (eight) hours as needed. For use with severe  pain  Depression - escitalopram (LEXAPRO) 20 MG tablet; Take 1 tablet (20 mg total) by mouth daily. I've explained to her that drugs of the SSRI class can have side effects such as weight gain, sexual dysfunction, insomnia, headache, nausea. These medications are generally effective at alleviating symptoms of depression.Will increase dose of SSRI today and reevaluate in 2 months.   Need for influenza vaccination Influenza injection received.  Explained side effects and contraindications to patient. Information sheet given to patient.   Return in about 2 months (around 01/24/2014) for depression.  Due to language barrier, an interpreter was present during the history-taking and subsequent discussion (and for part of the physical exam) with this patient.       Chari Manning, [place] [facility] [phone] 11/24/2013, 10:49 AM

## 2013-11-24 NOTE — Patient Instructions (Addendum)
Informacin sobre los Electronics engineer (Oral Contraception Information) Los anticonceptivos orales (ACO) son medicamentos que se utilizan para Therapist, occupational. Su funcin es [employer] ovarios liberen vulos. Las hormonas de los ACO tambin hacen que el moco cervical se haga ms espeso, lo que evita que el esperma ingrese al tero. Tambin hacen que la membrana que recubre internamente al tero se vuelva ms fina, lo que no permite que el huevo fertilizado se adhiera a la pared del tero. Los ACO son muy efectivos cuando se toman exactamente como se prescriben. Sin embargo, no previenen contra las enfermedades de transmisin sexual (ETS). La prctica del sexo seguro, como el uso de preservativos, junto con la [place], [place] a prevenir ese tipo de enfermedades.  Antes de tomar la pldora, usted debe hacerse un examen fsico y un test de Pap. El mdico podr indicarle anlisis de [place], si es necesario. El mdico se asegurar de que usted sea [place] buena candidata para usar anticonceptivos orales. [place] con su mdico acerca de los posibles efectos secundarios de los ACO que podran recetarle. Cuando se inicia el uso de ACO, puede llevar 2 a 3 meses para que su organismo se adapte a los cambios en los niveles hormonales.  TIPOS DE ANTICONCEPTIVOS ORALES  Pldora combinada: esta pldora contiene las hormonas estrgeno y progestina (progesterona sinttica). La pldora combinada viene en envases para [address] [place]. Algunos tipos de pldoras combinadas deben tomarse de manera continua (pldoras para 365 das). En los envases para [address] usted no tomar las pldoras durante 7 das despus de la ltima pldora. En los envases para [address], la pldora se toma [employer]. Las ltimas 7 no contienen hormonas. Ciertos tipos de pldoras tienen ms de 21 pldoras que contienen hormonas. En los envases para [address] las primeras 84 pldoras contienen ambas hormonas y las ltimas 7 pldoras  no contienen hormonas o contienen slo Therapist, occupational.  La minipldora: esta pldora contiene la hormona progesterona solamente. Es necesario tomarla todos los das de [place] continua. Es importante que las tome a la misma hora todos [place]. Viene en envases de 28 pldoras. Las 28 pldoras contienen la hormona.  [place] los sntomas premenstruales.   Se [place] para tratar los [employer].   Regula el ciclo menstrual.   Disminuye el ciclo menstrual abundante.   Puede mejorar el acn, segn el tipo de pldora.   Trata hemorragias uterinas anormales.   Trata el sndrome ovrico poliqustico.   Trata la endometriosis.   Pueden usarse como anticonceptivo de Freight forwarder.  FACTORES QUE PUEDEN HACER QUE LOS ANTICONCEPTIVOS ORALES SEAN MENOS EFECTIVOS Pueden ser menos efectivos si:   Olvid tomar la [employer] a la misma hora.   Tiene una enfermedad estomacal o intestinal que disminuye la absorcin de la pldora.   Ingiere simultneamente los anticonceptivos orales junto con otros medicamentos que los hacen menos efectivos, como antibiticos, ciertos medicamentos para el VIH y algunos medicamentos para las convulsiones.   Usted toma anticonceptivos orales que han vencido.   Cuando se [place] el envase de 21 das, se olvida de recomenzar el uso [employer] 7.  RIESGOS ASOCIADOS AL USO DE ANTICONCEPTIVOS ORALES  Los anticonceptivos orales pueden en algunos casos causar efectos secundarios como:  Dolor de [place].  Nuseas.  Inflamacin mamaria.  Hemorragia vaginal o manchado irregular. Las pldoras combinadas tambin se asocian a un pequeo aumento en el riesgo de:  Cogulos  sanguneos.  Ataque cardaco.  [place]. Document Released: 10/05/2004 Document Revised: 10/16/2012 [facility] Patient Information 2015 [place]. This information is not intended to replace advice given to you by your health care provider.  Make sure you discuss any questions you have with your health care provider.  Endometriosis (Endometriosis) La endometriosis es una enfermedad en la que el tejido que rodea al tero (endometrio) crece fuera de su ubicacin normal. El tejido puede crecer en muchos lugares cerca del tero, pero comnmente crece en los ovarios, las trompas de Falop[place], la vagina o el intestino. Dado que el tero expulsa o desprende su revestimiento en cada ciclo menstrual, hay sangrado en el lugar donde se localiza el tejido endometrial. Esto puede causar dolor porque la sangre es irritante para los tejidos que no estn normalmente expuestos a Librarian, academic.  CAUSAS  Se desconoce la causa de la endometriosis.  SIGNOS Y SNTOMAS  A menudo, no hay sntomas. Cuando se presentan sntomas, estos pueden variar segn la ubicacin del tejido desplazado. Pueden ocurrir diversos sntomas en diferentes momentos. Aunque los sntomas se producen principalmente durante el perodo menstrual de [place], tambin pueden aparecer en la mitad del [place], y generalmente terminan con la menopausia. Algunas personas pueden pasar meses sin experimentar ningn tipo de sntomas. Los sntomas pueden incluir:   Dolor abdominal o en la espalda.  Sangrado ms abundante durante los perodos [place].  [place].  Dolor al defecar.  Infertilidad. DIAGNSTICO  El mdico le preguntar acerca de sus sntomas y le har un examen fsico. Se pueden realizar varios estudios, por ejemplo:   Anlisis de [place] y [place]. Estos se realizan para ayudar a Sport and exercise psychologist.  Ecografas. Este estudio se realiza para observar el tejido anormal.  Radiografa del recto (enema de bario).  Laparoscopia. En este procedimiento, se introduce en el abdomen un tubo delgado, que emite luz y tiene una pequea cmara en el extremo (laparoscopio). Esto ayuda a que su mdico observe el tejido anormal para confirmar el diagnstico. El  mdico tambin puede tomar una pequea [place] de tejido anormal (biopsia) que encuentra. Posteriormente esta muestra de tejido se enva a un laboratorio para examinarlo con un microscopio. [place] y puede incluir lo siguiente:   Medicamentos para Best boy. Los antiinflamatorios no esteroides Dayna Ramus) son un tipo de analgsico que pueden ayudar a Best boy causado por la endometriosis.  Terapia hormonal. Cuando se use la terapia hormonal, se eliminan los perodos menstruales. Esto elimina la exposicin mensual a la sangre por el tejido endometrial desplazado.  Ciruga. Algunas veces puede hacerse una ciruga para extirpar el tejido endometrial anormal. Cuando los casos son graves, puede realizarse una ciruga para extirpar las trompas de [place], el tero y los ovarios (histerectoma). [place] todos los [employer] se lo haya indicado el mdico. No tome aspirina porque este medicamento puede aumentar el sangrado cuando no recibe terapia hormonal.  Evite actividades que produzcan dolor, incluida la actividad sexual. SOLICITE ATENCIN MDICA SI:  Tiene dolor plvico durante los perodos menstruales y antes y despus de [place].  Siente dolor plvico [employer] perodos menstruales que empeora durante el perodo.  Experimenta dolor plvico durante la actividad sexual o despus de [place].  Siente dolor plvico al defecar u orinar, especialmente durante el perodo menstrual.  Tiene dificultad para quedar embarazada.  Tiene fiebre. SOLICITE ATENCIN MDICA DE INMEDIATO SI:   El dolor es intenso y no  responde a los analgsicos.  Siente nuseas y vmitos intensos, o no puede [employer].  Tiene dolor que se limita a la parte inferior derecha del abdomen.  Presenta hinchazn o aumento del dolor en el abdomen.  Observa sangre en la materia fecal. ASEGRESE DE QUE:   Comprende estas  instrucciones.  Controlar su afeccin.  Recibir ayuda de inmediato si no mejora o si empeora. Document Released: 12/26/2004 Document Revised: 05/12/2013 [facility] Patient Information 2015 [place], [place]. This information is not intended to replace advice given to you by your health care provider. Make sure you discuss any questions you have with your health care provider.

## 2013-11-24 NOTE — Progress Notes (Signed)
Pt is here presenting abdomen pain for 3 months. Pt needs refills for her depression medication. Pt also suffers from lower back pain w/ no relief.

## 2013-11-25 ENCOUNTER — Encounter: Payer: Self-pay | Admitting: Internal Medicine

## 2013-11-25 DIAGNOSIS — F329 Major depressive disorder, single episode, unspecified: Secondary | ICD-10-CM | POA: Insufficient documentation

## 2013-11-25 DIAGNOSIS — F32A Depression, unspecified: Secondary | ICD-10-CM | POA: Insufficient documentation

## 2014-02-26 ENCOUNTER — Ambulatory Visit: Payer: Self-pay | Attending: Internal Medicine

## 2014-07-23 ENCOUNTER — Ambulatory Visit: Payer: Self-pay | Attending: Internal Medicine | Admitting: Internal Medicine

## 2014-07-23 ENCOUNTER — Encounter: Payer: Self-pay | Admitting: Internal Medicine

## 2014-07-23 VITALS — BP 118/78 | HR 85 | Temp 98.0°F | Resp 16 | Wt 182.8 lb

## 2014-07-23 DIAGNOSIS — F32A Depression, unspecified: Secondary | ICD-10-CM

## 2014-07-23 DIAGNOSIS — F329 Major depressive disorder, single episode, unspecified: Secondary | ICD-10-CM | POA: Insufficient documentation

## 2014-07-23 DIAGNOSIS — R102 Pelvic and perineal pain: Secondary | ICD-10-CM | POA: Insufficient documentation

## 2014-07-23 DIAGNOSIS — N809 Endometriosis, unspecified: Secondary | ICD-10-CM | POA: Insufficient documentation

## 2014-07-23 MED ORDER — NORGESTIMATE-ETH ESTRADIOL 0.25-35 MG-MCG PO TABS: 1.0000 | ORAL_TABLET | Freq: Every day | ORAL | Status: DC

## 2014-07-23 NOTE — Progress Notes (Signed)
Patient ID: Deanna Robles, female   DOB: 04-Jan-1981, 34 y.o.   MRN: [id number]  CC: med refill, depression  HPI: Deanna Robles is a 34 y.o. female here today for a follow up visit.  Patient has past medical history of fibroids/endometrosis and depression. Patient reports that she has continued to suffer from depression as a result of chronic pain. She reports that she stopped taking her Lexapro because she did not feel like it was helping her mood.   She feels tense with pain and more agitated because she is in pain all the time.   Patient has No headache, No chest pain, No Nausea, No new weakness tingling or numbness, No Cough - SOB.  No Known Allergies Past Medical History  Diagnosis Date  . Depression   . Fibroids    Current Outpatient Prescriptions on File Prior to Visit  Medication Sig Dispense Refill  . escitalopram (LEXAPRO) 20 MG tablet Take 1 tablet (20 mg total) by mouth daily. 30 tablet 3  . ibuprofen (ADVIL,MOTRIN) 800 MG tablet 800 mg every 6 (six) hours as needed.   1  . norgestimate-ethinyl estradiol (ORTHO-CYCLEN,SPRINTEC,PREVIFEM) 0.25-35 MG-MCG tablet Take 1 tablet by mouth daily. 1 Package 11  . omeprazole (PRILOSEC) 20 MG capsule Take 1 capsule (20 mg total) by mouth daily. 30 capsule 3  . traMADol (ULTRAM) 50 MG tablet Take 1 tablet (50 mg total) by mouth every 8 (eight) hours as needed. 60 tablet 0   No current facility-administered medications on file prior to visit.   Family History  Problem Relation Age of Onset  . Diabetes Brother   . Hypertension Mother   . Diabetes Mother   . Hyperlipidemia Mother   . Diabetes Father   . Hyperlipidemia Father   . Hypertension Father    History   Social History  . Marital Status: Married    Spouse Name: N/A  . Number of Children: N/A  . Years of Education: N/A   Occupational History  . Not on file.   Social History Main Topics  . Smoking status: Never Smoker   . Smokeless tobacco: Never Used  .  Alcohol Use: Yes     Comment: occasionally  . Drug Use: No  . Sexual Activity: Yes    Birth Control/ Protection: None   Other Topics Concern  . Not on file   Social History Narrative    Review of Systems: See HPI  Objective:   Filed Vitals:   07/23/14 1527  BP: 118/78  Pulse: 85  Temp: 98 F (36.7 C)  Resp: 16    Physical Exam  Cardiovascular: Normal rate, regular rhythm and normal heart sounds.   Pulmonary/Chest: Effort normal and breath sounds normal.  Abdominal: Soft. Bowel sounds are normal. She exhibits no mass. There is tenderness (pelvic).     Lab Results  Component Value Date   WBC 8.5 09/16/2013   HGB 13.7 09/16/2013   HCT 39.6 09/16/2013   MCV 87.2 09/16/2013   PLT 331 09/16/2013   Lab Results  Component Value Date   CREATININE 0.52 09/16/2013   BUN 14 09/16/2013   NA 137 09/16/2013   K 3.7 09/16/2013   CL 104 09/16/2013   CO2 25 09/16/2013    Lab Results  Component Value Date   HGBA1C 5.7* 09/16/2013   Lipid Panel     Component Value Date/Time   CHOL 206* 10/11/2012 1120   TRIG 119 10/11/2012 1120   HDL 44 10/11/2012 1120   CHOLHDL 4.7  10/11/2012 1120   VLDL 24 10/11/2012 1120   LDLCALC 138* 10/11/2012 1120       Assessment and plan:   Lennox was seen today for follow-up.  Diagnoses and all orders for this visit:  Pelvic pain in female Orders: -     Refill norgestimate-ethinyl estradiol (ORTHO-CYCLEN,SPRINTEC,PREVIFEM) 0.25-35 MG-MCG tablet; Take 1 tablet by mouth daily. From endometriosis   Endometriosis Orders: -     Ambulatory referral to Gynecology She will need to go back to GYN for further management   Depression Patient referred to [facility] [place]. Advised patient to go back on Lexapro at a higher dose to see if she notices any difference. I believe patient's depression will improve once pain is controlled.   Return if symptoms worsen or fail to improve.      Lance Bosch, [place]  [facility] [phone] 08/02/2014, 11:11 PM

## 2014-07-23 NOTE — Progress Notes (Signed)
Patient here for follow up on her depression and in need Of medication refills

## 2014-08-11 ENCOUNTER — Encounter: Payer: Self-pay | Admitting: Obstetrics & Gynecology

## 2014-09-04 ENCOUNTER — Encounter: Payer: Self-pay | Admitting: Obstetrics & Gynecology

## 2014-10-09 ENCOUNTER — Ambulatory Visit: Payer: Self-pay | Attending: Internal Medicine

## 2014-10-14 ENCOUNTER — Ambulatory Visit: Payer: Self-pay | Attending: Internal Medicine

## 2014-11-05 ENCOUNTER — Telehealth: Payer: Self-pay | Admitting: Internal Medicine

## 2014-11-05 NOTE — Telephone Encounter (Signed)
Patient came in requesting a medication refill for , norgestimate-ethinyl estradiol (ORTHO-CYCLEN,SPRINTEC,PREVIFEM) Patient needs medicine for endometriosis Please follow up with patient

## 2014-11-18 ENCOUNTER — Ambulatory Visit: Payer: Self-pay | Attending: Internal Medicine

## 2014-11-18 NOTE — Telephone Encounter (Signed)
Pt. Came in requesting a med refill on norgestimate-ethinyl estradiol (ORTHO-CYCLEN,SPRINTEC,PREVIFEM) 0.25-35 MG-MCG tablet. Please f/u with pt.

## 2014-11-20 NOTE — Telephone Encounter (Signed)
She has 11 refills on her birth control from last visit

## 2014-11-24 ENCOUNTER — Telehealth: Payer: Self-pay

## 2014-11-24 DIAGNOSIS — R102 Pelvic and perineal pain: Secondary | ICD-10-CM

## 2014-11-24 MED ORDER — NORGESTIMATE-ETH ESTRADIOL 0.25-35 MG-MCG PO TABS: 1.0000 | ORAL_TABLET | Freq: Every day | ORAL | Status: DC

## 2014-11-24 NOTE — Telephone Encounter (Signed)
In house interpreter used Returned patient phone call Home number is not a number related to patient Called patient on cell number Patient is aware that she has multiple refills on her birth control

## 2014-12-07 ENCOUNTER — Encounter: Payer: Self-pay | Admitting: Internal Medicine

## 2014-12-07 ENCOUNTER — Ambulatory Visit: Payer: Self-pay | Attending: Internal Medicine | Admitting: Internal Medicine

## 2014-12-07 VITALS — BP 112/73 | HR 71 | Temp 98.0°F | Resp 16 | Ht 63.0 in | Wt 186.0 lb

## 2014-12-07 DIAGNOSIS — N644 Mastodynia: Secondary | ICD-10-CM

## 2014-12-07 NOTE — Progress Notes (Signed)
Patient ID: Deanna Robles, female   DOB: 01-May-1980, 34 y.o.   MRN: [id number]  CC: right breast pain  HPI: Deanna Robles is a 34 y.o. female here today for a follow up visit.  Patient has past medical history of depression. Patient reports that she has been having right breast pain for 15 days. She states that the pain is burning and feels like something is going to explode in her breast. She has not noticed any lumps or nipple discharge. LMP was 11/13/14. She states that she usually has breast tenderness before her cycle but this pain is dif   No Known Allergies Past Medical History  Diagnosis Date  . Depression   . Fibroids    Current Outpatient Prescriptions on File Prior to Visit  Medication Sig Dispense Refill  . norgestimate-ethinyl estradiol (ORTHO-CYCLEN,SPRINTEC,PREVIFEM) 0.25-35 MG-MCG tablet Take 1 tablet by mouth daily. 1 Package 11  . escitalopram (LEXAPRO) 20 MG tablet Take 1 tablet (20 mg total) by mouth daily. 30 tablet 3  . ibuprofen (ADVIL,MOTRIN) 800 MG tablet 800 mg every 6 (six) hours as needed.   1  . omeprazole (PRILOSEC) 20 MG capsule Take 1 capsule (20 mg total) by mouth daily. 30 capsule 3  . traMADol (ULTRAM) 50 MG tablet Take 1 tablet (50 mg total) by mouth every 8 (eight) hours as needed. 60 tablet 0   No current facility-administered medications on file prior to visit.   Family History  Problem Relation Age of Onset  . Diabetes Brother   . Hypertension Mother   . Diabetes Mother   . Hyperlipidemia Mother   . Diabetes Father   . Hyperlipidemia Father   . Hypertension Father    Social History   Social History  . Marital Status: Married    Spouse Name: N/A  . Number of Children: N/A  . Years of Education: N/A   Occupational History  . Not on file.   Social History Main Topics  . Smoking status: Never Smoker   . Smokeless tobacco: Never Used  . Alcohol Use: Yes     Comment: occasionally  . Drug Use: No  . Sexual Activity: Yes   Birth Control/ Protection: None   Other Topics Concern  . Not on file   Social History Narrative    Review of Systems: Other than what is stated in HPI, all other systems are negative.   Objective:   Filed Vitals:   12/07/14 1529  BP: 112/73  Pulse: 71  Temp: 98 F (36.7 C)  Resp: 16    Physical Exam  Constitutional: She is oriented to person, place, and time.  Cardiovascular: Normal rate, regular rhythm and normal heart sounds.   Pulmonary/Chest: Effort normal and breath sounds normal. Right breast exhibits tenderness (severely tender in right upper outer quadrant). Right breast exhibits no inverted nipple. Left breast exhibits tenderness. Left breast exhibits no inverted nipple. There is no breast swelling.  Genitourinary: No breast discharge.  Neurological: She is alert and oriented to person, place, and time.  Psychiatric: She has a normal mood and affect.     Lab Results  Component Value Date   WBC 8.5 09/16/2013   HGB 13.7 09/16/2013   HCT 39.6 09/16/2013   MCV 87.2 09/16/2013   PLT 331 09/16/2013   Lab Results  Component Value Date   CREATININE 0.52 09/16/2013   BUN 14 09/16/2013   NA 137 09/16/2013   K 3.7 09/16/2013   CL 104 09/16/2013   CO2 25 09/16/2013  Lab Results  Component Value Date   HGBA1C 5.7* 09/16/2013   Lipid Panel     Component Value Date/Time   CHOL 206* 10/11/2012 1120   TRIG 119 10/11/2012 1120   HDL 44 10/11/2012 1120   CHOLHDL 4.7 10/11/2012 1120   VLDL 24 10/11/2012 1120   LDLCALC 138* 10/11/2012 1120       Assessment and plan:   [name] was seen today for breast pain.  Diagnoses and all orders for this visit:  Breast pain, right Patient is due for a cycle soon and I believe tenderness is a result of hormone changes. I have advised her to try Ibuprofen or Evening Primrose Oil for pain. She will call me after her cycle is over to let me know if tenderness/pain has improved.   Due to language barrier, an  interpreter was present during the history-taking and subsequent discussion (and for part of the physical exam) with this patient.  Return if symptoms worsen or fail to improve.   Lance Bosch, [place] [facility] [phone] 12/07/2014, 3:54 PM

## 2014-12-07 NOTE — Progress Notes (Signed)
Virtual interpreter used Deanna Robles ID# [id number] Patient complains of having right breast pain with a burning feeling Patient states at night it feels like something is going to "explode" in there Patient also requesting a refill on her birth control pills

## 2014-12-07 NOTE — Patient Instructions (Signed)
Evening Primose Oil---can get at the pharmacy. Will help with breast tenderness.

## 2015-04-12 ENCOUNTER — Other Ambulatory Visit: Payer: Self-pay | Admitting: *Deleted

## 2015-04-12 ENCOUNTER — Ambulatory Visit (INDEPENDENT_AMBULATORY_CARE_PROVIDER_SITE_OTHER): Payer: Self-pay

## 2015-04-12 ENCOUNTER — Ambulatory Visit (INDEPENDENT_AMBULATORY_CARE_PROVIDER_SITE_OTHER): Payer: Self-pay | Admitting: Family Medicine

## 2015-04-12 VITALS — BP 108/62 | HR 78 | Temp 97.9°F | Resp 16 | Ht 63.25 in | Wt 185.0 lb

## 2015-04-12 DIAGNOSIS — R0981 Nasal congestion: Secondary | ICD-10-CM

## 2015-04-12 DIAGNOSIS — R059 Cough, unspecified: Secondary | ICD-10-CM

## 2015-04-12 DIAGNOSIS — R509 Fever, unspecified: Secondary | ICD-10-CM

## 2015-04-12 DIAGNOSIS — R05 Cough: Secondary | ICD-10-CM

## 2015-04-12 LAB — POCT CBC
Granulocyte percent: 74.4 %G (ref 37–80)
HCT, POC: 38 % (ref 37.7–47.9)
Hemoglobin: 14.1 g/dL (ref 12.2–16.2)
Lymph, poc: 2.1 (ref 0.6–3.4)
MCH, POC: 32.3 pg — AB (ref 27–31.2)
MCHC: 37.1 g/dL — AB (ref 31.8–35.4)
MCV: 87.1 fL (ref 80–97)
MID (cbc): 0.6 (ref 0–0.9)
MPV: 7.3 fL (ref 0–99.8)
POC Granulocyte: 7.9 — AB (ref 2–6.9)
POC LYMPH PERCENT: 19.8 %L (ref 10–50)
POC MID %: 5.8 %M (ref 0–12)
Platelet Count, POC: 381 10*3/uL (ref 142–424)
RBC: 4.36 M/uL (ref 4.04–5.48)
RDW, POC: 11.9 %
WBC: 10.6 10*3/uL — AB (ref 4.6–10.2)

## 2015-04-12 MED ORDER — HYDROCODONE-HOMATROPINE 5-1.5 MG/5ML PO SYRP: 5.0000 mL | ORAL_SOLUTION | Freq: Two times a day (BID) | ORAL | Status: DC | PRN

## 2015-04-12 MED ORDER — BENZONATATE 100 MG PO CAPS
100.0000 mg | ORAL_CAPSULE | Freq: Three times a day (TID) | ORAL | Status: DC | PRN
Start: 2015-04-12 — End: 2016-11-08

## 2015-04-12 NOTE — Progress Notes (Signed)
[place] at [facility] [address] [place], [place] [address] [phone]  Date:  04/12/2015   Name:  Deanna Robles   DOB:  1980-02-16   MRN:  [id number]  PCP:  Lance Bosch, NP    Chief Complaint: Insomnia; Sore Throat; Back Pain; Chest congestion; Headache; Dizziness; and Nasal Congestion   History of Present Illness:  Deanna Robles is a 35 y.o. very pleasant female patient who presents with the following: Cough x 3 weeks:  - Cough worsening - Chest/back pain worse with cough - No use of inhaler/cig in the past - Fever, did not check temp. The day before yesterday - Drinking water, no gatorade or pedialyte. Minimal PO intake.  - "scratchy" throat.   - Sister sick previously as well as husband.  - Significant congestion.  - Was improving until 5 days ago.   Patient Active Problem List   Diagnosis Date Noted  . Depression 11/25/2013  . Routine general medical examination at a health care facility 10/11/2012  . GERD (gastroesophageal reflux disease) 10/11/2012  . Dental caries 08/08/2012  . Chronic dental pain 08/08/2012    Past Medical History  Diagnosis Date  . Depression   . Fibroids     History reviewed. No pertinent past surgical history.  Social History  Substance Use Topics  . Smoking status: Never Smoker   . Smokeless tobacco: Never Used  . Alcohol Use: Yes     Comment: occasionally    Family History  Problem Relation Age of Onset  . Diabetes Brother   . Hypertension Mother   . Diabetes Mother   . Hyperlipidemia Mother   . Diabetes Father   . Hyperlipidemia Father   . Hypertension Father     No Known Allergies  Medication list has been reviewed and updated.  Current Outpatient Prescriptions on File Prior to Visit  Medication Sig Dispense Refill  . norgestimate-ethinyl estradiol (ORTHO-CYCLEN,SPRINTEC,PREVIFEM) 0.25-35 MG-MCG tablet Take 1 tablet by mouth daily. 1 Package 11  .  ibuprofen (ADVIL,MOTRIN) 800 MG tablet 800 mg every 6 (six) hours as needed. Reported on 04/12/2015  1   No current facility-administered medications on file prior to visit.    Review of Systems: Review of Systems  Constitutional: Positive for fever, chills, malaise/fatigue and diaphoresis.  HENT: Positive for congestion, ear pain (throbbing, minimal pain) and sore throat (scratchy).   Eyes: Negative for blurred vision and double vision.  Respiratory: Positive for cough. Negative for hemoptysis, sputum production, shortness of breath and wheezing.   Cardiovascular: Negative for chest pain and palpitations.  Gastrointestinal: Negative for nausea, vomiting, diarrhea and constipation.  Genitourinary: Negative for dysuria, urgency and frequency.  Musculoskeletal: Positive for myalgias.  Skin: Negative for rash.  Neurological: Positive for headaches.    Physical Examination: Filed Vitals:   04/12/15 0946  BP: 108/62  Pulse: 78  Temp: 97.9 F (36.6 C)  Resp: 16   Filed Vitals:   04/12/15 0946  Height: 5' 3.25" (1.607 m)  Weight: 185 lb (83.915 kg)   Body mass index is 32.49 kg/(m^2). Ideal Body Weight: Weight in (lb) to have BMI = 25: 142  GEN: WDWN, NAD, Non-toxic, A & O x 3 HEENT: Atraumatic, Normocephalic. Neck supple. No masses, No LAD. Ears and Nose: No external deformity. CV: RRR, No M/G/R. No JVD. No thrill. No extra heart sounds. PULM: CTA B, no wheezes, crackles, rhonchi. No retractions. No resp. distress. No accessory muscle use. ABD:  S, NT, ND, +BS. No rebound. No HSM. EXTR: No c/c/e NEURO Normal gait.  PSYCH: Normally interactive. Conversant. Not depressed or anxious appearing.  Calm demeanor.   CXR, rad read: low lung volumes, otherwise normal  Assessment and Plan: Cough x 3 weeks: Improving 1 week ago with acute worsening. Vitals wnl. Exam benign. XR/cbc benign.  Likely primarily viral congestion with post nasal drip.  - Tessalon perles and hycodan. Discussed  sedative s/e of hycodan.  - Flonase/nasal saline/netti pot - f/u pending symptom course, which we discussed.   Signed Gerre Pebbles, MD

## 2015-04-12 NOTE — Patient Instructions (Addendum)
IF you received an x-ray today, you will receive an invoice from [facility]. Please contact [facility] at [phone] with questions or concerns regarding your invoice.   IF you received labwork today, you will receive an invoice from [employer]. Please contact [facility] at [phone] with questions or concerns regarding your invoice.   Our billing staff will not be able to assist you with questions regarding bills from these companies.  You will be contacted with the lab results as soon as they are available. The fastest way to get your results is to activate your My Chart account. Instructions are located on the last page of this paperwork. If you have not heard from [name] regarding the results in 2 weeks, please contact this office.    Flonase or Nasacort are options for your congestion.  Also can use nasal saline.  Infeccin del tracto respiratorio superior, adultos (Upper Respiratory Infection, Adult) La mayora de las infecciones del tracto respiratorio superior son infecciones virales de las vas que llevan el aire a los pulmones. Un infeccin del tracto respiratorio superior afecta la nariz, la garganta y las vas respiratorias superiores. El tipo ms frecuente de infeccin del tracto respiratorio superior es la nasofaringitis, que habitualmente se conoce como "resfro comn". Las infecciones del tracto respiratorio superior siguen su curso y por lo general se curan solas. En la [employer], la infeccin del tracto respiratorio superior no requiere atencin [place], Armed forces training and education officer a veces, despus de una infeccin viral, puede surgir una infeccin bacteriana en las vas respiratorias superiores. Esto se conoce como infeccin secundaria. Las infecciones sinusales y en el odo medio son tipos frecuentes de infecciones secundarias en el tracto respiratorio superior. La neumona bacteriana tambin puede complicar un cuadro de infeccin del tracto  respiratorio superior. Este tipo de infeccin puede empeorar el asma y la enfermedad pulmonar obstructiva crnica (EPOC). En algunos casos, estas complicaciones pueden requerir atencin mdica de emergencia y poner en peligro la vida.  CAUSAS Casi todas las infecciones del tracto respiratorio superior se deben a los virus. Un virus es un tipo de microbio que puede contagiarse de Ardelia Mems persona a Theatre manager.  FACTORES DE RIESGO Puede estar en riesgo de sufrir una infeccin del tracto respiratorio superior si:   Fuma.  Tiene una enfermedad pulmonar o cardaca crnica.  Tiene debilitado el sistema de defensa (inmunitario) del cuerpo.  Es muy joven o de edad muy [place].  Tiene asma o alergias nasales.  Trabaja en reas donde hay mucha gente o poca ventilacin.  Governor Rooks en una escuela o en un centro de atencin mdica. SIGNOS Y SNTOMAS  Habitualmente, los sntomas aparecen de 2a 3das despus de entrar en contacto con el virus del resfro. La mayora de las infecciones virales en el tracto respiratorio superior duran de 7a 10das. Sin embargo, las infecciones virales en el tracto respiratorio superior a causa del virus de la gripe pueden durar de 14a 18das y, habitualmente, son ms graves. Entre los sntomas se pueden incluir los siguientes:   Secrecin o congestin nasal.  Estornudos.  Tos.  Dolor de Investment banker, operational.  Dolor de [place].  Fatiga.  Cristy Hilts.  Prdida del apetito.  Dolor en la frente, detrs de los ojos y por encima de los pmulos (dolor sinusal).  Dolores musculares. DIAGNSTICO  El mdico puede diagnosticar una infeccin del tracto respiratorio superior mediante los siguientes estudios:  Examen fsico.  Pruebas para verificar si los sntomas no se deben a otra afeccin, por ejemplo:  Faringitis estreptoccica.  Sinusitis.  Neumona.  Asma. TRATAMIENTO  Esta infeccin desaparece sola, con el tiempo. No puede curarse con medicamentos, pero a menudo se  prescriben para aliviar los sntomas. Los medicamentos pueden ser tiles para lo siguiente:  Engineer, materials fiebre.  Reducir la tos.  Aliviar la congestin nasal. INSTRUCCIONES PARA EL CUIDADO EN EL HOGAR   Tome los medicamentos solamente como se lo haya indicado el mdico.  A fin de Best boy de garganta, haga grgaras con solucin salina templada o consuma caramelos para la tos, como se lo haya indicado el mdico.  Use un humidificador de vapor clido o inhale el vapor de la ducha para aumentar la humedad del aire. Esto facilitar la respiracin.  Beba suficiente lquido para Consulting civil engineer orina clara o de color amarillo plido.  Consuma sopas y otros caldos transparentes, y [employer].  Descanse todo lo que sea necesario.  Regrese al [place] cuando la temperatura se le haya normalizado o cuando el mdico lo autorice. Es posible que deba quedarse en su casa durante un tiempo prolongado, para no infectar a los dems. Sausal usar un barbijo y lavarse las manos con cuidado para Mining engineer propagacin del virus.  Aumente el uso del inhalador si tiene asma.  No consuma ningn producto que contenga tabaco, lo que incluye cigarrillos, tabaco de Higher education careers adviser o Psychologist, sport and exercise. Si necesita ayuda para dejar de fumar, consulte al [employer]. PREVENCIN  La mejor manera de protegerse de un resfro es mantener una higiene [place].   Evite el contacto oral o fsico con personas que tengan sntomas de resfro.  En caso de contacto, lvese las manos con frecuencia. No hay pruebas claras de que la vitaminaC, la vitaminaE, la equincea o el ejercicio reduzcan la probabilidad de Museum/gallery curator un resfro. Sin embargo, siempre se recomienda Scientific laboratory technician, hacer ejercicio y Ecologist.  SOLICITE ATENCIN MDICA SI:   Su estado empeora en lugar de mejorar.  Los medicamentos no Animator.  Tiene escalofros.  La sensacin de falta de aire empeora.  Tiene  mucosidad marrn o roja.  Tiene secrecin nasal amarilla o marrn.  Le duele la cara, especialmente al inclinarse hacia adelante.  Tiene fiebre.  Tiene los ganglios del cuello hinchados.  Siente dolor al tragar.  Tiene zonas blancas en la parte de atrs de la garganta. SOLICITE ATENCIN MDICA DE INMEDIATO SI:   Tiene sntomas intensos o persistentes de:  Dolor de [place].  Dolor de odos.  Dolor sinusal.  Dolor en el pecho.  Tiene enfermedad pulmonar crnica y cualquiera de estos sntomas:  Sibilancias.  Tos prolongada.  Tos con sangre.  Cambio en la mucosidad habitual.  Presenta rigidez en el cuello.  Tiene cambios en:  La visin.  La audicin.  El pensamiento.  El [place] de nimo. ASEGRESE DE QUE:   Comprende estas instrucciones.  Controlar su afeccin.  Recibir ayuda de inmediato si no mejora o si empeora.   Esta informacin no tiene Marine scientist el consejo del mdico. Asegrese de hacerle al mdico cualquier pregunta que tenga.   Document Released: 10/05/2004 Document Revised: 05/12/2014 Elsevier Interactive Patient Education 2016 [place] en los adultos (Cough, Adult) La tos es un reflejo que limpia la garganta y las vas respiratorias, y ayuda a la curacin y Health visitor de los pulmones. Es normal toser de [employer], pero cuando esta se presenta con otros sntomas o dura mucho tiempo puede ser el signo de [place] enfermedad  que necesita tratamiento. La tos puede durar solo 2 o 3semanas (aguda) o ms de 8semanas (crnica). CAUSAS Comnmente, las causas de la tos son las siguientes:  Advice worker sustancias que [employer].  Una infeccin respiratoria viral o bacteriana.  Alergias.  Asma.  Goteo posnasal.  Fumar.  El retroceso de cido estomacal hacia el esfago (reflujo gastroesofgico).  Algunos medicamentos.  Los problemas pulmonares crnicos, entre ellos, la enfermedad pulmonar obstructiva crnica  (EPOC) (o, en contadas ocasiones, el cncer de pulmn).  Otras afecciones, como la insuficiencia cardaca. INSTRUCCIONES PARA EL CUIDADO EN EL HOGAR  Est atento a cualquier cambio en los sntomas. Tome estas medidas para Public house manager las molestias:  Tome los medicamentos solamente como se lo haya indicado el mdico.  Si le recetaron un antibitico, tmelo como se lo haya indicado el mdico. No deje de tomar los antibiticos aunque comience a sentirse mejor.  Hable con el mdico antes de tomar un antitusivo.  Beba suficiente lquido para Consulting civil engineer orina clara o de color amarillo plido.  Si el aire est seco, use un vaporizador o un humidificador con vapor fro en su habitacin o en su casa para ayudar a aflojar las secreciones.  Evite todas las cosas que le producen tos en el trabajo o en su casa.  Si la tos aumenta durante la noche, intente dormir semisentado.  Evite el humo del cigarrillo. Si fuma, deje de hacerlo. Si necesita ayuda para dejar de fumar, consulte al mdico.  Evite la cafena.  Evite el alcohol.  Descanse todo lo que sea necesario. SOLICITE ATENCIN MDICA SI:   Aparecen nuevos sntomas.  Expectora pus al toser.  La tos no mejora despus de 2 o 3semanas, o empeora.  No puede controlar la tos con antitusivos y no puede dormir bien.  Tiene un dolor que se intensifica o que no puede Research scientist (life sciences).  Tiene fiebre.  [place] de peso sin causa aparente.  Tiene transpiracin nocturna. SOLICITE ATENCIN MDICA DE INMEDIATO SI:  Tose y escupe sangre.  Tiene dificultad para respirar.  Los latidos cardacos son muy rpidos.   Esta informacin no tiene Marine scientist el consejo del mdico. Asegrese de hacerle al mdico cualquier pregunta que tenga.   Document Released: 08/03/2010 Document Revised: 09/16/2014 Elsevier Interactive Patient Education [employer].

## 2015-06-25 ENCOUNTER — Ambulatory Visit: Payer: Self-pay | Attending: Internal Medicine

## 2015-08-30 ENCOUNTER — Telehealth: Payer: Self-pay | Admitting: Internal Medicine

## 2015-08-30 NOTE — Telephone Encounter (Signed)
Pt came in the office to schedule an appt and speak nurse regarding meds and sinus pain that she is experiencing. Pt needs refill norgestimate-ethinyl estradiol (ORTHO-CYCLEN,SPRINTEC,PREVIFEM) 0.25-35 MG-MCG tablet. Pt was not able to schedule appt but will call August 28. Please follow up. Patient only speaks spanish  [phone])  Thank you.

## 2015-08-30 NOTE — Telephone Encounter (Signed)
Pt came in the office to schedule an appt and speak nurse regarding meds and sinus pain that she is experiencing. Pt needs refill norgestimate-ethinyl estradiol (ORTHO-CYCLEN,SPRINTEC,PREVIFEM) 0.25-35 MG-MCG tablet. Pt was not able to schedule appt but will call August 28. Please follow up. Patient only speaks spanish.

## 2015-08-31 NOTE — Telephone Encounter (Signed)
Pt. Called stating that she went to her pharmacy and was told that she did not have any more refills for norgestimate-ethinyl estradiol (ORTHO-CYCLEN,SPRINTEC,PREVIFEM) 0.25-35 MG-MCG tablet. Please f/u with pt.

## 2015-08-31 NOTE — Telephone Encounter (Signed)
Left pt a message to please call me regarding meds.

## 2015-09-02 ENCOUNTER — Other Ambulatory Visit: Payer: Self-pay | Admitting: *Deleted

## 2015-09-21 ENCOUNTER — Ambulatory Visit: Payer: Self-pay | Attending: Internal Medicine | Admitting: Internal Medicine

## 2015-09-21 ENCOUNTER — Encounter: Payer: Self-pay | Admitting: Internal Medicine

## 2015-09-21 VITALS — BP 113/72 | HR 67 | Temp 98.7°F | Resp 16 | Wt 178.8 lb

## 2015-09-21 DIAGNOSIS — R519 Headache, unspecified: Secondary | ICD-10-CM

## 2015-09-21 DIAGNOSIS — J0110 Acute frontal sinusitis, unspecified: Secondary | ICD-10-CM

## 2015-09-21 DIAGNOSIS — J302 Other seasonal allergic rhinitis: Secondary | ICD-10-CM

## 2015-09-21 DIAGNOSIS — R51 Headache: Secondary | ICD-10-CM

## 2015-09-21 MED ORDER — AMOXICILLIN-POT CLAVULANATE 875-125 MG PO TABS: 1.0000 | ORAL_TABLET | Freq: Two times a day (BID) | ORAL | 0 refills | Status: DC

## 2015-09-21 MED ORDER — LORATADINE 10 MG PO TABS: 10.0000 mg | ORAL_TABLET | Freq: Every day | ORAL | 11 refills | Status: DC

## 2015-09-21 MED ORDER — FLUTICASONE PROPIONATE 50 MCG/ACT NA SUSP: 2.0000 | Freq: Every day | NASAL | 6 refills | Status: DC

## 2015-09-21 NOTE — Patient Instructions (Addendum)
Probiotic -- ie Activia yogurt 2x day while on Antibiotics., ie pills Florastor probiotics 1 tab 2x day // OTC   -  Sinusitis, adultos (Sinusitis, Adult)  La sinusitis es la irritacin, dolor, e hinchazn (inflamacin) de las cavidades de aire en los huesos de la cara (senos paranasales). La irritacin, Conservation officer, historic buildings, e hinchazn puede hacer que el aire y el moco se queden atrapados en los senos paranasales. Esto hace que los grmenes se multipliquen y causen una infeccin.  CUIDADOS EN EL HOGAR   Beba gran cantidad de lquido para mantener el pis (orina) de tono claro o amarillo plido.  Use un humidificador en su hogar.  Deje correr el agua caliente de la ducha para producir vapor en el bao. Sintese en el bao con la puerta cerrada. Inhale vapor de agua 3 a 4 veces al da.  Ponga un pao caliente y hmedo en el rostro 3 a 4 veces al da, o segn las indicaciones de su mdico.  Use aerosoles de agua salada (aerosoles de solucin salina) para Air cabin crew las secreciones nasales espesas. Esto puede ayudar al drenaje de los senos paranasales.  Slo tome los medicamentos que le indique el mdico. SOLICITE AYUDA DE INMEDIATO SI:   El dolor empeora.  Siente un dolor de cabeza intenso.  Tiene Higher education careers adviser (nuseas).  Vomita.  Tiene mucho sueo (somnolencia).  El rostro est inflamado (hinchado).  Hay cambios en la visin.  Presenta rigidez en el cuello.  Tiene dificultad para respirar. ASEGRESE DE QUE:   Comprende estas instrucciones.  Controlar su enfermedad.  Solicitar ayuda de inmediato si no mejora o si empeora.   Esta informacin no tiene Marine scientist el consejo del mdico. Asegrese de hacerle al mdico cualquier pregunta que tenga.   Document Released: 09/20/2011 Document Revised: 05/12/2014 Elsevier Interactive Patient Education [employer].

## 2015-09-21 NOTE — Progress Notes (Signed)
Pt is in the office today for establish care and sinus issues Pt states she is having a serve bad headache Pt states her pain level is a 8 Pt states the headache is located on her right side of head Pt states she has been taking ibuprofen and it has not helped

## 2015-09-22 NOTE — Progress Notes (Signed)
Deanna Robles, is a 35 y.o. female  [id number]  [id number]  DOB - [birth year]  CC:  Chief Complaint  Patient presents with  . Sinus Problem  . Establish Care       HPI: Deanna Robles is a 35 y.o. female here today to establish medical care. Pt w/ hx of possible endometriosis on OCP for birth control.    Presents w/ frontal headache on going for last few months every time she gets sinus congestion.  She c/o of having "weird smell" out of her nose now, w/ yellow nasal congestion when she tries to blow her nose. No f/c.  Intermittent dry cough, nonproductive.   Headache associated w/ light irritation/photophobia, but not n/v, and mainly only related to when she has sinus congestion.  Possible seasonal allergies, does not take anything, denies pruritis of eyes/runny nose.  Wants to continue taking OCP since it is helping her regulate her cycles.  She is here today w/ her young preteen dgt.   Patient has  No chest pain, No abdominal pain - No Nausea, No new weakness tingling or numbness, No Cough - SOB.   Interpreter was used to communicate directly with patient for the entire encounter including providing detailed patient instructions.       Review of Systems: Per hpi, o/w all systems reviewed and negative.  No Known Allergies Past Medical History:  Diagnosis Date  . Depression   . Fibroids    Current Outpatient Prescriptions on File Prior to Visit  Medication Sig Dispense Refill  . ibuprofen (ADVIL,MOTRIN) 800 MG tablet 800 mg every 6 (six) hours as needed. Reported on 04/12/2015  1  . norgestimate-ethinyl estradiol (ORTHO-CYCLEN,SPRINTEC,PREVIFEM) 0.25-35 MG-MCG tablet Take 1 tablet by mouth daily. 1 Package 11  . benzonatate (TESSALON) 100 MG capsule Take 1-2 capsules (100-200 mg total) by mouth 3 (three) times daily as needed for cough. (Patient not taking: Reported on 09/21/2015) 40 capsule 0  . HYDROcodone-homatropine (HYCODAN) 5-1.5 MG/5ML syrup Take 5  mLs by mouth every 12 (twelve) hours as needed for cough. (Patient not taking: Reported on 09/21/2015) 120 mL 0   No current facility-administered medications on file prior to visit.    Family History  Problem Relation Age of Onset  . Diabetes Brother   . Hypertension Mother   . Diabetes Mother   . Hyperlipidemia Mother   . Diabetes Father   . Hyperlipidemia Father   . Hypertension Father    Social History   Social History  . Marital status: Married    Spouse name: N/A  . Number of children: N/A  . Years of education: N/A   Occupational History  . Not on file.   Social History Main Topics  . Smoking status: Never Smoker  . Smokeless tobacco: Never Used  . Alcohol use Yes     Comment: occasionally  . Drug use: No  . Sexual activity: Yes    Birth control/ protection: None   Other Topics Concern  . Not on file   Social History Narrative  . No narrative on file    Objective:   Vitals:   09/21/15 1057  BP: 113/72  Pulse: 67  Resp: 16  Temp: 98.7 F (37.1 C)    Filed Weights   09/21/15 1057  Weight: 178 lb 12.8 oz (81.1 kg)    BP Readings from Last 3 Encounters:  09/21/15 113/72  04/12/15 108/62  12/07/14 112/73    Physical Exam: Constitutional: Patient appears well-developed and well-nourished. No distress.  AAOx3, obese, pleasant HENT: Normocephalic, atraumatic, External right and left ear normal. Oropharynx is clear and moist.  bilat TMs clear. Boggy bilateral naires.  TTP right Facial sinus. nttp left facial sinus or bilat max sinus. Eyes: Conjunctivae and EOM are normal. PERRL, no scleral icterus. Neck: Normal ROM. Neck supple. No JVD.  CVS: RRR, S1/S2 +, no murmurs, no gallops, no carotid bruit.  Pulmonary: Effort and breath sounds normal, no stridor, rhonchi, wheezes, rales.  Abdominal: Soft. BS +, obese, no distension, tenderness, rebound or guarding.  Musculoskeletal: Normal range of motion. No edema and no tenderness.  LE: bilat/ no c/c/e,  pulses 2+ bilateral. Lymphadenopathy: No lymphadenopathy noted, cervical Neuro: Alert.  muscle tone coordination wnl. No cranial nerve deficit grossly. Skin: Skin is warm and dry. No rash noted. Not diaphoretic. No erythema. No pallor. Psychiatric: Normal mood and affect. Behavior, judgment, thought content normal.  Lab Results  Component Value Date   WBC 10.6 (A) 04/12/2015   HGB 14.1 04/12/2015   HCT 38.0 04/12/2015   MCV 87.1 04/12/2015   PLT 331 09/16/2013   Lab Results  Component Value Date   CREATININE 0.52 09/16/2013   BUN 14 09/16/2013   NA 137 09/16/2013   K 3.7 09/16/2013   CL 104 09/16/2013   CO2 25 09/16/2013    Lab Results  Component Value Date   HGBA1C 5.7 (H) 09/16/2013   Lipid Panel     Component Value Date/Time   CHOL 206 (H) 10/11/2012 1120   TRIG 119 10/11/2012 1120   HDL 44 10/11/2012 1120   CHOLHDL 4.7 10/11/2012 1120   VLDL 24 10/11/2012 1120   LDLCALC 138 (H) 10/11/2012 1120       Depression screen [facility] 2/9 09/21/2015 04/12/2015 07/23/2014 11/13/2013 08/08/2012  Decreased Interest 0 0 1 3 0  Down, Depressed, Hopeless 0 0 2 1 0  PHQ - 2 Score 0 0 3 4 0  Altered sleeping - - 1 3 -  Tired, decreased energy - - 3 3 -  Change in appetite - - (No Data) 3 -  Feeling bad or failure about yourself  - - (No Data) 0 -  Trouble concentrating - - 0 0 -  Moving slowly or fidgety/restless - - - 0 -  Suicidal thoughts - - - 0 -  PHQ-9 Score - - 7 13 -    Assessment and plan:   1. Acute frontal sinusitis, recurrence not specified, associated w/ headache - augmentin x 2 wks - flonase nasal spray - claritin tria  2. Seasonal allergies, may be contributing to #1 Trial claritin and flonase  3. Headache, unspecified headache type Likely 2nd to #1. Doubt migraines given strong association w/ the sinus problems.  4. Possible hx of endometriosis  pt wants to cont same OCP   Return in about 2 months (around 11/21/2015), or if symptoms worsen or fail to  improve.  The patient was given clear instructions to go to ER or return to medical center if symptoms don't improve, worsen or new problems develop. The patient verbalized understanding. The patient was told to call to get lab results if they haven't heard anything in the next week.    This note has been created with Surveyor, quantity. Any transcriptional errors are unintentional.   Maren Reamer, MD, [place], [place]   09/22/2015, 9:12 AM

## 2015-12-15 ENCOUNTER — Ambulatory Visit: Payer: Self-pay | Attending: Internal Medicine | Admitting: Internal Medicine

## 2015-12-15 ENCOUNTER — Encounter: Payer: Self-pay | Admitting: Internal Medicine

## 2015-12-15 VITALS — BP 109/71 | HR 49 | Temp 98.1°F | Resp 16 | Wt 175.6 lb

## 2015-12-15 DIAGNOSIS — N809 Endometriosis, unspecified: Secondary | ICD-10-CM | POA: Insufficient documentation

## 2015-12-15 DIAGNOSIS — Z23 Encounter for immunization: Secondary | ICD-10-CM | POA: Insufficient documentation

## 2015-12-15 DIAGNOSIS — Z114 Encounter for screening for human immunodeficiency virus [HIV]: Secondary | ICD-10-CM | POA: Insufficient documentation

## 2015-12-15 DIAGNOSIS — R102 Pelvic and perineal pain: Secondary | ICD-10-CM | POA: Insufficient documentation

## 2015-12-15 DIAGNOSIS — Z0000 Encounter for general adult medical examination without abnormal findings: Secondary | ICD-10-CM

## 2015-12-15 DIAGNOSIS — Z79899 Other long term (current) drug therapy: Secondary | ICD-10-CM | POA: Insufficient documentation

## 2015-12-15 LAB — BASIC METABOLIC PANEL WITH GFR
BUN: 10 mg/dL (ref 7–25)
CHLORIDE: 104 mmol/L (ref 98–110)
CO2: 21 mmol/L (ref 20–31)
Calcium: 9 mg/dL (ref 8.6–10.2)
Creat: 0.48 mg/dL — ABNORMAL LOW (ref 0.50–1.10)
GFR, Est African American: >89 mL/min (ref >=60)
GFR, Est Non African American: >89 mL/min (ref >=60)
Glucose, Bld: 89 mg/dL (ref 65–99)
POTASSIUM: 4.2 mmol/L (ref 3.5–5.3)
SODIUM: 138 mmol/L (ref 135–146)

## 2015-12-15 LAB — POCT URINE PREGNANCY: PREG TEST UR: NEGATIVE

## 2015-12-15 LAB — CBC WITH DIFFERENTIAL/PLATELET
BASOS PCT: 0 %
Basophils Absolute: 0 cells/uL (ref 0–200)
Eosinophils Absolute: 114 cells/uL (ref 15–500)
Eosinophils Relative: 2 %
HCT: 42.4 % (ref 35.0–45.0)
Hemoglobin: 14.2 g/dL (ref 11.7–15.5)
LYMPHS PCT: 40 %
Lymphs Abs: 2280 cells/uL (ref 850–3900)
MCH: 31 pg (ref 27.0–33.0)
MCHC: 33.5 g/dL (ref 32.0–36.0)
MCV: 92.6 fL (ref 80.0–100.0)
MONOS PCT: 6 %
MPV: 10.1 fL (ref 7.5–12.5)
Monocytes Absolute: 342 cells/uL (ref 200–950)
NEUTROS ABS: 2964 {cells}/uL (ref 1500–7800)
Neutrophils Relative %: 52 %
PLATELETS: 314 10*3/uL (ref 140–400)
RBC: 4.58 MIL/uL (ref 3.80–5.10)
RDW: 12.8 % (ref 11.0–15.0)
WBC: 5.7 10*3/uL (ref 3.8–10.8)

## 2015-12-15 LAB — TSH: TSH: 1.28 m[IU]/L

## 2015-12-15 LAB — POCT GLYCOSYLATED HEMOGLOBIN (HGB A1C): HEMOGLOBIN A1C: 5.2

## 2015-12-15 MED ORDER — NORGESTIMATE-ETH ESTRADIOL 0.25-35 MG-MCG PO TABS: 1.0000 | ORAL_TABLET | Freq: Every day | ORAL | 11 refills | Status: DC

## 2015-12-15 MED ORDER — IBUPROFEN 800 MG PO TABS: 800.0000 mg | ORAL_TABLET | Freq: Four times a day (QID) | ORAL | 1 refills | Status: DC | PRN

## 2015-12-15 NOTE — Patient Instructions (Addendum)
Endometriosis (Endometriosis) La endometriosis es una enfermedad en la que el tejido que rodea al tero (endometrio) crece fuera de su ubicacin normal. El tejido puede crecer en muchos lugares cerca del tero, pero comnmente crece en los ovarios, las trompas de [place], la vagina o el intestino. Dado que el tero expulsa o desprende su revestimiento en cada ciclo menstrual, hay sangrado en el lugar donde se localiza el tejido endometrial. Esto puede causar dolor porque la sangre es irritante para los tejidos que no estn normalmente expuestos a Librarian, academic.  CAUSAS  Se desconoce la causa de la endometriosis.  SIGNOS Y SNTOMAS  A menudo, no hay sntomas. Cuando se presentan sntomas, estos pueden variar segn la ubicacin del tejido desplazado. Pueden ocurrir diversos sntomas en diferentes momentos. Aunque los sntomas se producen principalmente durante el perodo menstrual de [place], tambin pueden aparecer en la mitad del [place], y generalmente terminan con la menopausia. Algunas personas pueden pasar meses sin experimentar ningn tipo de sntomas. Los sntomas pueden incluir:   Dolor abdominal o en la espalda.  Sangrado ms abundante durante los perodos [place].  [place].  Dolor al defecar.  Infertilidad. DIAGNSTICO  El mdico le preguntar acerca de sus sntomas y le har un examen fsico. Se pueden realizar varios estudios, por ejemplo:   Anlis[place] de [place] y [place]. Estos se realizan para ayudar a Sport and exercise psychologist.  Ecografas. Este estudio se realiza para observar el tejido anormal.  Radiografa del recto (enema de bario).  Laparoscopia. En este procedimiento, se introduce en el abdomen un tubo delgado, que emite luz y tiene una pequea cmara en el extremo (laparoscopio). Esto ayuda a que su mdico observe el tejido anormal para confirmar el diagnstico. El mdico tambin puede tomar una pequea [place] de tejido anormal (biopsia) que  encuentra. Posteriormente esta muestra de tejido se enva a un laboratorio para examinarlo con un microscopio. [place] y puede incluir lo siguiente:   Medicamentos para Best boy. Los antiinflamatorios no esteroides Dayna Ramus) son un tipo de analgsico que pueden ayudar a Best boy causado por la endometriosis.  Terapia hormonal. Cuando se use la terapia hormonal, se eliminan los perodos menstruales. Esto elimina la exposicin mensual a la sangre por el tejido endometrial desplazado.  Ciruga. Algunas veces puede hacerse una ciruga para extirpar el tejido endometrial anormal. Cuando los casos son graves, puede realizarse una ciruga para extirpar las trompas de [place], el tero y los ovarios (histerectoma). [place] todos los [employer] se lo haya indicado el mdico. No tome aspirina porque este medicamento puede aumentar el sangrado cuando no recibe terapia hormonal.  Evite actividades que produzcan dolor, incluida la actividad sexual. SOLICITE ATENCIN MDICA SI:  Tiene dolor plvico durante los perodos menstruales y antes y despus de [place].  Siente dolor plvico [employer] perodos menstruales que empeora durante el perodo.  Experimenta dolor plvico durante la actividad sexual o despus de [place].  Siente dolor plvico al defecar u orinar, especialmente durante el perodo menstrual.  Tiene dificultad para quedar embarazada.  Tiene fiebre. SOLICITE ATENCIN MDICA DE INMEDIATO SI:   El dolor es intenso y no responde a los analgsicos.  Siente nuseas y vmitos intensos, o no puede [employer].  Tiene dolor que se limita a la parte inferior derecha del abdomen.  Presenta hinchazn o aumento del dolor en el abdomen.  Lollie Marrow en la materia fecal. ASEGRESE DE QUE:  Comprende estas instrucciones.  Controlar su afeccin.  Recibir ayuda de inmediato si no mejora o si  empeora. Esta informacin no tiene Marine scientist el consejo del mdico. Asegrese de hacerle al mdico cualquier pregunta que tenga. Document Released: 12/26/2004 Document Revised: 04/19/2015 Elsevier Interactive Patient Education  2017 [place] Td (contra la difteria y el ttanos): Lo que debe saber (Td Vaccine Margaretmary Eddy and Diphtheria]: What You Need to Know) 1. Por qu vacunarse? El ttanos y la difteria son enfermedades muy graves. Son Futures trader frecuentes en los [place] actualmente, pero las personas que se infectan suelen tener complicaciones graves. La vacuna Td se [place] para proteger a los adolescentes y a los adultos de ambas enfermedades. Tanto el ttanos como la difteria son infecciones causadas por bacterias. La difteria se transmite de persona a persona a travs de la tos o el estornudo. La bacteria que causa el ttanos entra al cuerpo a travs de cortes, raspones o heridas. El TTANOS (trismo) provoca entumecimiento y Writer dolorosa de los msculos, por lo general, en todo el cuerpo.  Puede causar el endurecimiento de los msculos de la cabeza y el cuello, de modo que impide abrir la boca, tragar y en algunos casos, Ambulance person. El ttanos es causa de muerte en aproximadamente 1de cada 10personas que contraen la infeccin, incluso despus de que reciben la mejor atencin mdica. La DIFTERIA puede hacer que se forme una membrana gruesa en la parte posterior de la garganta.  Puede causar problemas respiratorios, parlisis, insuficiencia cardaca e incluso la muerte. Antes de las vacunas, en los Estados [place] se informaban 200000 casos de difteria y cientos de casos de ttanos cada ao. Desde que comenz la vacunacin, los informes de casos de ambas enfermedades se han reducido en un 99%. 2. Edward Jolly Td La vacuna Td protege a adolescentes y adultos contra el ttanos y la difteria. La vacuna Td habitualmente se aplica como dosis de refuerzo cada 10aos, pero  tambin puede administrarse antes si la persona sufre una [place] o herida sucia y grave. A veces, en lugar de la vacuna Td, se recomienda una vacuna llamada Tdap, que protege contra la tosferina, adems de proteger contra el ttanos y la difteria. El mdico o la persona que le aplique la vacuna puede darle ms informacin al [employer]. La Td puede administrarse de manera segura simultneamente con otras vacunas. 3. Algunas personas no deben recibir la vacuna  Una persona que alguna vez ha tenido una reaccin alrgica potencialmente mortal a una dosis anterior de cualquier vacuna contra el ttanos o la difteria, O que tenga una alergia grave a cualquier parte de esta vacuna, no debe recibir la vacuna Td. Informe a la persona que le aplica la vacuna si usted tiene cualquier alergia grave.  Consulte con su mdico si:  tuvo hinchazn o dolor intenso despus de recibir cualquier vacuna contra la difteria o el ttanos,  alguna vez ha sufrido el sndrome de [place],  no se siente Pharmacologist en que se ha programado la vacuna. 4. [place] de [place] reaccin a la vacuna Con cualquier medicamento, incluyendo las vacunas, existe la posibilidad de que aparezcan efectos secundarios. Suelen ser leves y desaparecen por s solos. Tambin son posibles las reacciones graves, pero en raras ocasiones. Tama personas a las que se les aplica la vacuna Td no tienen ningn problema. Problemas leves despus de la vacuna Td: (No interfirieron en otras actividades)  Dolor en el lugar donde se aplic la vacuna (alrededor de  8de cada 10personas)  Enrojecimiento o hinchazn en el lugar donde se aplic la vacuna (alrededor de 1de cada 4personas)  Fiebre leve (poco frecuente)  Dolor de Pensions consultant (alrededor de 1de cada 4personas)  Cansancio (alrededor de 1de cada 4personas) Problemas moderados despus de la vacuna Td: (Interfirieron en otras actividades, pero no requirieron atencin  mdica)  Fiebre superior a 102F (38,8C) (poco frecuente) Problemas graves despus de la vacuna Td: (Impidieron Optometrist las actividades habituales; requirieron atencin mdica)  Clinical cytogeneticist, dolor intenso, sangrado o enrojecimiento en el brazo en que se aplic la vacuna (poco frecuente). Problemas que podran ocurrir despus de cualquier vacuna:   Las personas a veces se desmayan despus de un procedimiento mdico, incluida la vacunacin. Si permanece sentado o recostado durante 15 minutos puede ayudar a [employer]ill Lynch y las lesiones causadas por las cadas. Informe al mdico si se siente mareado, tiene cambios en la visin o zumbidos en los odos.  Algunas personas sienten un dolor intenso en el hombro y tienen dificultad para mover el brazo donde se coloc la vacuna. Esto sucede con muy poca frecuencia.  Cualquier medicamento puede causar una reaccin alrgica grave. Dichas reacciones son Orlene Erm poco frecuentes con una vacuna (se calcula que menos de 1en un milln de dosis) y se producen de unos minutos a unas horas despus de Writer. Al igual que con cualquier [employer], existe una probabilidad muy remota de que una vacuna cause una lesin grave o la [place]. Se controla permanentemente la seguridad de las vacunas. Para obtener ms informacin, visite: http://www.aguilar.org/. 5. Qu pasa si hay una reaccin grave? A qu signos debo estar atento?   Observe todo lo que le preocupe, como signos de una reaccin alrgica grave, fiebre muy alta o comportamiento fuera de lo normal. Los signos de una reaccin alrgica grave pueden incluir ronchas, hinchazn de la cara y la garganta, dificultad para respirar, latidos cardacos acelerados, mareos y debilidad. Generalmente, estos comenzaran entre unos pocos minutos y algunas horas despus de la vacunacin. Qu debo hacer?   Si usted piensa que se trata de una reaccin alrgica grave o de otra emergencia que no puede esperar,  llame al 911 o dirjase al hospital ms cercano. Sino, llame a su mdico.  Despus, la reaccin debe informarse al Sistema de Informacin sobre Efectos Adversos de las [place] (Vaccine Adverse Event Reporting System, VAERS). Su mdico puede presentar este informe, o puede hacerlo usted mismo a travs del sitio web de VAERS, en www.vaers.SamedayNews.es, o llamando al [phone]. VAERS no brinda recomendaciones mdicas.  6. [place] Compensacin de Daos por [place] de Compensacin de Daos por Clinical biochemist (National Vaccine Injury Compensation Program, VICP) es un programa federal que fue creado para Patent examiner a las personas que puedan haber sufrido daos al recibir ciertas vacunas. Aquellas personas que consideren que han sufrido un dao como consecuencia de una vacuna y [place] saber ms acerca del programa y de cmo presentar Raechel Chute, pueden llamar al [phone] o visitar su sitio web en GoldCloset.com.ee. Hay un lmite de tiempo para presentar un reclamo de compensacin. 7. Cmo puedo obtener ms informacin?  Consulte a su mdico. Este puede darle el prospecto de la vacuna o recomendarle otras fuentes de informacin.  Comunquese con el servicio de salud de su localidad o su estado.  Comunquese con los Centros para Building surveyor y la Prevencin de Probation officer for Disease Control and Prevention , CDC).  Llame al [phone] (1-800-CDC-INFO).  Visite el sitio [employer]  CDC en http://hunter.com/. Declaracin de informacin sobre la vacuna contra la difteria y el ttanos (Td) de los CDC (04/20/15) Esta informacin no tiene Marine scientist el consejo del mdico. Asegrese de hacerle al mdico cualquier pregunta que tenga. Document Released: 04/13/2008 Document Revised: 01/16/2014 Document Reviewed: 04/20/2015 Elsevier Interactive Patient Education  2017 [place]. Influenza Virus Vaccine injection (Fluarix) Qu es este  medicamento? La VACUNA ANTIGRIPAL ayuda a disminuir el riesgo de contraer la influenza, tambin conocida como la gripe. La vacuna solo ayuda a protegerle contra algunas cepas de influenza. Esta vacuna no ayuda a reducir Catering manager de contraer influenza pandmica H1N1. Este medicamento puede ser utilizado para otros usos; si tiene alguna pregunta consulte con su proveedor de atencin mdica o con su farmacutico. MARCAS COMUNES: Fluarix, Fluzone Qu le debo informar a mi profesional de la salud antes de tomar este medicamento? Necesita saber si usted presenta alguno de los siguientes problemas o situaciones: -trastorno de sangrado como hemofilia -fiebre o infeccin -sndrome de Guillain-Barre u otros problemas neurolgicos -problemas del sistema inmunolgico -infeccin por el virus de la inmunodeficiencia humana (VIH) o SIDA -niveles bajos de plaquetas en la sangre -esclerosis mltiple -una Risk analyst o inusual a las vacunas antigripales, a los huevos, protenas de pollo, al ltex, a la gentamicina, a otros medicamentos, alimentos, colorantes o conservantes -si est embarazada o buscando quedar embarazada -si est amamantando a un beb Cmo debo utilizar este medicamento? Esta vacuna se administra mediante inyeccin por va intramuscular. Lo administra un profesional de [employer]	[place]. Recibir una copia de informacin escrita sobre la vacuna antes de cada vacuna. Asegrese de leer este folleto cada vez cuidadosamente. Este folleto puede cambiar con frecuencia. Hable con su pediatra para informarse acerca del uso de este medicamento en nios. Puede requerir atencin especial. Sobredosis: Pngase en contacto inmediatamente con un centro toxicolgico o una sala de urgencia si usted cree que haya tomado demasiado medicamento. ATENCIN: [employer] es solo para usted. No comparta este medicamento con nadie. Qu sucede si me olvido de una dosis? No se aplica en este caso. Qu puede  interactuar con este medicamento? -quimioterapia o radioterapia -medicamentos que suprimen el sistema inmunolgico, tales como etanercept, anakinra, infliximab y adalimumab -medicamentos que tratan o previenen cogulos sanguneos, como warfarina -fenitona -medicamentos esteroideos, como la prednisona o la cortisona -teofilina -vacunas Puede ser que esta lista no menciona todas las posibles interacciones. Informe a su profesional de [employer]	[place] de [employer] productos a base de hierbas, medicamentos de [place] o suplementos nutritivos que est tomando. Si usted fuma, consume bebidas alcohlicas o si utiliza drogas ilegales, indqueselo tambin a su profesional de [employer]	[place]. Algunas sustancias pueden interactuar con su medicamento. A qu debo estar atento al usar [employer]? Informe a su mdico o a Barrister's clerk de la [employer] todos los efectos secundarios que persistan despus de 3 das. Llame a su proveedor de atencin mdica si se presentan sntomas inusuales dentro de las 6 semanas posteriores a la vacunacin. Es posible que todava pueda contraer la gripe, pero la enfermedad no ser tan fuerte como normalmente. No puede contraer la gripe de esta vacuna. La vacuna antigripal no le protege contra resfros u otras enfermedades que pueden causar [place]. Debe vacunarse cada ao. Qu efectos secundarios puedo tener al [employer] este medicamento? Efectos secundarios que debe informar a su mdico o a Barrister's clerk de la salud tan pronto como sea posible: -reacciones alrgicas como erupcin cutnea, picazn o urticarias, hinchazn de la cara, labios o  lengua Efectos secundarios que, por lo general, no requieren atencin mdica (debe informarlos a su mdico o a su profesional de la salud si persisten o si son molestos): -fiebre -dolor de cabeza -molestias y dolores musculares -dolor, sensibilidad, enrojecimiento o Estate agent de la inyeccin -cansancio o debilidad Puede ser que  esta lista no menciona todos los posibles efectos secundarios. Comunquese a su mdico por asesoramiento mdico [employer]. Usted puede informar los efectos secundarios a la FDA por telfono al 1-800-FDA-1088. Dnde debo guardar mi medicina? Esta vacuna se administra solamente en clnicas, farmacias, consultorio mdico u otro consultorio de un profesional de la salud y no Sports coach en su domicilio. ATENCIN: Este folleto es un resumen. Puede ser que no cubra toda la posible informacin. Si usted tiene preguntas acerca de esta medicina, consulte con su mdico, su farmacutico o su profesional de Technical sales engineer.  2017 Elsevier/Gold Standard (2009-06-29 15:31:40)

## 2015-12-15 NOTE — Progress Notes (Signed)
Deanna Robles, is a 35 y.o. female  [id number]  [id number]  DOB - 06-Sep-1980  Chief Complaint  Patient presents with  . Endometriosis        Subjective:   Deanna Robles is a 35 y.o. female here today for a follow up visit, last seen 09/26/15 for acute sinusitis, resolved. Back for abd cramps, last menses last week. Per pt, has hx of endometriosis, on OCP which helps control it and her dysmenorrhea.  Denies heavy menses when on OCP, ran out of it last month.  Not interested in ob eval for now for surgical eval if ocp and nsaids alone will control pain.   Patient has No headache, No chest pain, No abdominal pain - No Nausea, No new weakness tingling or numbness, No Cough - SOB.  She is here w/ her older dgt, who is interpreting.  No problems updated.  ALLERGIES: No Known Allergies  PAST MEDICAL HISTORY: Past Medical History:  Diagnosis Date  . Depression   . Fibroids     MEDICATIONS AT HOME: Prior to Admission medications   Medication Sig Start Date End Date Taking? Authorizing Provider  amoxicillin-clavulanate (AUGMENTIN) 875-125 MG tablet Take 1 tablet by mouth 2 (two) times daily. Patient not taking: Reported on 12/15/2015 09/21/15   Maren Reamer, MD  benzonatate (TESSALON) 100 MG capsule Take 1-2 capsules (100-200 mg total) by mouth 3 (three) times daily as needed for cough. Patient not taking: Reported on 09/21/2015 04/12/15   Gerre Pebbles, MD  fluticasone [facility]) 50 MCG/ACT nasal spray Place 2 sprays into both nostrils daily. 09/21/15   Maren Reamer, MD  HYDROcodone-homatropine (HYCODAN) 5-1.5 MG/5ML syrup Take 5 mLs by mouth every 12 (twelve) hours as needed for cough. Patient not taking: Reported on 09/21/2015 04/12/15   Gerre Pebbles, MD  ibuprofen (ADVIL,MOTRIN) 800 MG tablet Take 1 tablet (800 mg total) by mouth every 6 (six) hours as needed for cramping. Take with food 12/15/15   Maren Reamer, MD  loratadine (CLARITIN) 10 MG tablet Take 1  tablet (10 mg total) by mouth daily. 09/21/15   Maren Reamer, MD  norgestimate-ethinyl estradiol (ORTHO-CYCLEN,SPRINTEC,PREVIFEM) 0.25-35 MG-MCG tablet Take 1 tablet by mouth daily. 12/15/15   Maren Reamer, MD     Objective:   Vitals:   12/15/15 0910  BP: 109/71  Pulse: (!) 49  Resp: 16  Temp: 98.1 F (36.7 C)  TempSrc: Oral  SpO2: 98%  Weight: 175 lb 9.6 oz (79.7 kg)    Exam General appearance : Awake, alert, not in any distress. Speech Clear. Not toxic looking, pleasant. HEENT: Atraumatic and Normocephalic, pupils equally reactive to light. Neck: supple, no JVD.  Chest:Good air entry bilaterally, no added sounds. CVS: S1 S2 regular, no murmurs/gallups or rubs. Abdomen: Bowel sounds active,  not distended with no gaurding, rigidity or rebound.  Mild ttp bilat lateral pelvic regions, but nttp mid-pelvic region. No palpable masses. Extremities: B/L Lower Ext shows no edema, both legs are warm to touch Neurology: Awake alert, and oriented X 3, CN II-XII grossly intact, Non focal Skin:No Rash  Data Review Lab Results  Component Value Date   HGBA1C 5.7 (H) 09/16/2013   HGBA1C 5.6 10/11/2012    Depression screen [facility] 2/9 12/15/2015 09/21/2015 04/12/2015 07/23/2014 11/13/2013  Decreased Interest 0 0 0 1 3  Down, Depressed, Hopeless 1 0 0 2 1  PHQ - 2 Score 1 0 0 3 4  Altered sleeping - - - 1 3  Tired, decreased  energy - - - 3 3  Change in appetite - - - (No Data) 3  Feeling bad or failure about yourself  - - - (No Data) 0  Trouble concentrating - - - 0 0  Moving slowly or fidgety/restless - - - - 0  Suicidal thoughts - - - - 0  PHQ-9 Score - - - 7 13      Assessment & Plan   1. Endometriosis Info provided, dw pt options of treatment, including renewing ocp/nsaids. Ultimately if pains persist despite these trx, may need eval by Ob for laproscopy.  Pt wants to hold off on ob eval for now. - POCT urine pregnancy - BASIC METABOLIC PANEL WITH GFR - CBC with  Differential - renewed ocp and motrin 800mg  prn, advised to take w/ food  2. Pelvic pain in female Per #1 - norgestimate-ethinyl estradiol (ORTHO-CYCLEN,SPRINTEC,PREVIFEM) 0.25-35 MG-MCG tablet; Take 1 tablet by mouth daily.  Dispense: 1 Package; Refill: 11  3 Healthcare maintenance - HgB A1c - TSH - VITAMIN D 25 Hydroxy (Vit-D Deficiency, Fractures)  4. Encounter for screening for HIV - HIV antibody (with reflex)  5. tdap and flu vac today.   Patient have been counseled extensively about nutrition and exercise  Return in about 3 months (around 03/14/2016), or if symptoms worsen or fail to improve.  The patient was given clear instructions to go to ER or return to medical center if symptoms don't improve, worsen or new problems develop. The patient verbalized understanding. The patient was told to call to get lab results if they haven't heard anything in the next week.   This note has been created with Surveyor, quantity. Any transcriptional errors are unintentional.   Maren Reamer, MD, [place] [facility] [place], [place]   12/15/2015, 9:24 AM

## 2015-12-16 LAB — VITAMIN D 25 HYDROXY (VIT D DEFICIENCY, FRACTURES): VIT D 25 HYDROXY: 21 ng/mL — AB (ref 30–100)

## 2015-12-16 LAB — HIV ANTIBODY (ROUTINE TESTING W REFLEX): HIV: NONREACTIVE

## 2015-12-23 ENCOUNTER — Telehealth: Payer: Self-pay

## 2015-12-23 NOTE — Telephone Encounter (Signed)
[employer] [place] ID: [id number] contacted pt to go over lab results pt didn't answer lvm asking pt to give me a call at her earliest convenience

## 2016-02-16 ENCOUNTER — Ambulatory Visit: Payer: Self-pay | Attending: Internal Medicine

## 2016-05-25 ENCOUNTER — Encounter: Payer: Self-pay | Admitting: Internal Medicine

## 2016-05-26 ENCOUNTER — Encounter: Payer: Self-pay | Admitting: Internal Medicine

## 2016-05-29 ENCOUNTER — Encounter: Payer: Self-pay | Admitting: Internal Medicine

## 2016-11-03 ENCOUNTER — Ambulatory Visit: Payer: Self-pay | Attending: Internal Medicine

## 2016-11-08 ENCOUNTER — Encounter: Payer: Self-pay | Admitting: Nurse Practitioner

## 2016-11-08 ENCOUNTER — Ambulatory Visit: Payer: Self-pay | Attending: Nurse Practitioner | Admitting: Nurse Practitioner

## 2016-11-08 VITALS — BP 117/78 | HR 64 | Temp 98.5°F | Ht 63.0 in | Wt 171.2 lb

## 2016-11-08 DIAGNOSIS — R51 Headache: Secondary | ICD-10-CM | POA: Insufficient documentation

## 2016-11-08 DIAGNOSIS — F329 Major depressive disorder, single episode, unspecified: Secondary | ICD-10-CM | POA: Insufficient documentation

## 2016-11-08 DIAGNOSIS — Z6839 Body mass index (BMI) 39.0-39.9, adult: Secondary | ICD-10-CM | POA: Insufficient documentation

## 2016-11-08 DIAGNOSIS — B9689 Other specified bacterial agents as the cause of diseases classified elsewhere: Secondary | ICD-10-CM

## 2016-11-08 DIAGNOSIS — Z760 Encounter for issue of repeat prescription: Secondary | ICD-10-CM | POA: Insufficient documentation

## 2016-11-08 DIAGNOSIS — E669 Obesity, unspecified: Secondary | ICD-10-CM

## 2016-11-08 DIAGNOSIS — J019 Acute sinusitis, unspecified: Secondary | ICD-10-CM | POA: Insufficient documentation

## 2016-11-08 MED ORDER — AMOXICILLIN-POT CLAVULANATE 875-125 MG PO TABS: 1.0000 | ORAL_TABLET | Freq: Two times a day (BID) | ORAL | 0 refills | Status: DC

## 2016-11-08 MED ORDER — AMOXICILLIN-POT CLAVULANATE 875-125 MG PO TABS: 1.0000 | ORAL_TABLET | Freq: Two times a day (BID) | ORAL | 0 refills | Status: AC

## 2016-11-08 NOTE — Patient Instructions (Signed)
Sinusitis, Adult Sinusitis is soreness and inflammation of your sinuses. Sinuses are hollow spaces in the bones around your face. Your sinuses are located:  Around your eyes.  In the middle of your forehead.  Behind your nose.  In your cheekbones.  Your sinuses and nasal passages are lined with a stringy fluid (mucus). Mucus normally drains out of your sinuses. When your nasal tissues become inflamed or swollen, the mucus can become trapped or blocked so air cannot flow through your sinuses. This allows bacteria, viruses, and funguses to grow, which leads to infection. Sinusitis can develop quickly and last for 7?10 days (acute) or for more than 12 weeks (chronic). Sinusitis often develops after a cold. What are the causes? This condition is caused by anything that creates swelling in the sinuses or stops mucus from draining, including:  Allergies.  Asthma.  Bacterial or viral infection.  Abnormally shaped bones between the nasal passages.  Nasal growths that contain mucus (nasal polyps).  Narrow sinus openings.  Pollutants, such as chemicals or irritants in the air.  A foreign object stuck in the nose.  A fungal infection. This is rare.  What increases the risk? The following factors may make you more likely to develop this condition:  Having allergies or asthma.  Having had a recent cold or respiratory tract infection.  Having structural deformities or blockages in your nose or sinuses.  Having a weak immune system.  Doing a lot of swimming or diving.  Overusing nasal sprays.  Smoking.  What are the signs or symptoms? The main symptoms of this condition are pain and a feeling of pressure around the affected sinuses. Other symptoms include:  Upper toothache.  Earache.  Headache.  Bad breath.  Decreased sense of smell and taste.  A cough that may get worse at night.  Fatigue.  Fever.  Thick drainage from your nose. The drainage is often green and  it may contain pus (purulent).  Stuffy nose or congestion.  Postnasal drip. This is when extra mucus collects in the throat or back of the nose.  Swelling and warmth over the affected sinuses.  Sore throat.  Sensitivity to light.  How is this diagnosed? This condition is diagnosed based on symptoms, a medical history, and a physical exam. To find out if your condition is acute or chronic, your health care provider may:  Look in your nose for signs of nasal polyps.  Tap over the affected sinus to check for signs of infection.  View the inside of your sinuses using an imaging device that has a light attached (endoscope).  If your health care provider suspects that you have chronic sinusitis, you may also:  Be tested for allergies.  Have a sample of mucus taken from your nose (nasal culture) and checked for bacteria.  Have a mucus sample examined to see if your sinusitis is related to an allergy.  If your sinusitis does not respond to treatment and it lasts longer than 8 weeks, you may have an MRI or CT scan to check your sinuses. These scans also help to determine how severe your infection is. In rare cases, a bone biopsy may be done to rule out more serious types of fungal sinus disease. How is this treated? Treatment for sinusitis depends on the cause and whether your condition is chronic or acute. If a virus is causing your sinusitis, your symptoms will go away on their own within 10 days. You may be given medicines to relieve your symptoms,   including:  Topical nasal decongestants. They shrink swollen nasal passages and let mucus drain from your sinuses.  Antihistamines. These drugs block inflammation that is triggered by allergies. This can help to ease swelling in your nose and sinuses.  Topical nasal corticosteroids. These are nasal sprays that ease inflammation and swelling in your nose and sinuses.  Nasal saline washes. These rinses can help to get rid of thick mucus in  your nose.  If your condition is caused by bacteria, you will be given an antibiotic medicine. If your condition is caused by a fungus, you will be given an antifungal medicine. Surgery may be needed to correct underlying conditions, such as narrow nasal passages. Surgery may also be needed to remove polyps. Follow these instructions at home: Medicines  Take, use, or apply over-the-counter and prescription medicines only as told by your health care provider. These may include nasal sprays.  If you were prescribed an antibiotic medicine, take it as told by your health care provider. Do not stop taking the antibiotic even if you start to feel better. Hydrate and Humidify  Drink enough water to keep your urine clear or pale yellow. Staying hydrated will help to thin your mucus.  Use a cool mist humidifier to keep the humidity level in your home above 50%.  Inhale steam for 10-15 minutes, 3-4 times a day or as told by your health care provider. You can do this in the bathroom while a hot shower is running.  Limit your exposure to cool or dry air. Rest  Rest as much as possible.  Sleep with your head raised (elevated).  Make sure to get enough sleep each night. General instructions  Apply a warm, moist washcloth to your face 3-4 times a day or as told by your health care provider. This will help with discomfort.  Wash your hands often with soap and water to reduce your exposure to viruses and other germs. If soap and water are not available, use hand sanitizer.  Do not smoke. Avoid being around people who are smoking (secondhand smoke).  Keep all follow-up visits as told by your health care provider. This is important. Contact a health care provider if:  You have a fever.  Your symptoms get worse.  Your symptoms do not improve within 10 days. Get help right away if:  You have a severe headache.  You have persistent vomiting.  You have pain or swelling around your face or  eyes.  You have vision problems.  You develop confusion.  Your neck is stiff.  You have trouble breathing. This information is not intended to replace advice given to you by your health care provider. Make sure you discuss any questions you have with your health care provider. Document Released: 12/26/2004 Document Revised: 08/22/2015 Document Reviewed: 10/21/2014 Elsevier Interactive Patient Education  2017 [place].  Sinusitis en adultos (Sinusitis, Adult) La sinusitis es la inflamacin y Conservation officer, historic buildings en los senos paranasales. Los senos paranasales son espacios vacos en los huesos alrededor del rostro. Los senos paranasales se encuentran ubicados:  Alrededor de los ojos.  En la mitad de la frente.  Detrs de Mudlogger.  En los pmulos. Los senos y las fosas nasales estn cubiertos de un lquido fibroso (mucosidad). Normalmente, la mucosidad drena a travs de los senos. Cuando los tejidos nasales se inflaman o hinchan, la mucosidad puede quedar atrapada o bloqueada, de modo que no puede fluir por los senos paranasales. Esto fomenta la proliferacin de bacterias, virus y  hongos, lo que produce infecciones. La sinusitis puede desarrollarse rpidamente y durar entre 7 y 10das (aguda) o ms de 12das (crnica). A menudo, esta afeccin surge despus de un resfriado. CAUSAS Esta afeccin es causada por cualquier sustancia que inflame los senos o evite que la mucosidad drene, por ejemplo:  Alergias.  Asma.  Infecciones virales o bacterianas.  Huesos con forma [employer] las fosas nasales.  Crecimientos nasales que contienen mucosidad (plipos nasales).  Aberturas sinusales estrechas.  Agentes contaminantes, como sustancias qumicas o irritantes presentes en el aire.  Un cuerpo extrao atorado [employer].  Infecciones por hongos. Esto es raro. [place] hacer que usted sea propenso a sufrir esta afeccin:  Product manager o  asma.  Haber tenido una infeccin reciente en las vas respiratorias superiores o un resfriado.  Tener deformidades estructurales o bloqueos en la nariz o los senos.  Tener un sistema inmunitario dbil.  Nadar o bucear mucho.  Abusar de los [employer].  Fumar. SNTOMAS Los principales sntomas de esta afeccin son dolor y sensacin de presin alrededor de los senos afectados. Otros sntomas pueden ser los siguientes:  Dolor en los dientes superiores.  Dolor de odos.  Dolor de [place].  Mal aliento.  Disminucin del sentido del olfato y del gusto.  Tos que empeora por la noche.  Fatiga.  Cristy Hilts.  Mucosidad espesa que sale de la Lawyer. Generalmente, es de color verde y puede contener pus (purulento).  Nariz tapada o congestin nasal.  Goteo posnasal. Esto ocurre cuando se acumula mucosidad adicional en la garganta o la parte de atrs de la Lawyer.  Hinchazn y calor en los senos paranasales afectados.  Dolor de Investment banker, operational.  Sensibilidad a Naval architect. DIAGNSTICO Esta enfermedad se diagnostica en funcin de los sntomas, los antecedentes mdicos y un examen fsico. Para descubrir si su afeccin es aguda o crnica, el mdico podra hacer lo siguiente:  Revisar su nariz en busca de plipos nasales.  Palpar los senos paranasales afectados para buscar signos de infeccin.  Observar la parte interna de los senos paranasales con un dispositivo que tiene una luz (endoscopio). Si el mdico sospecha que usted padece sinusitis crnica, podra indicarle lo siguiente:  Pruebas de alergias.  Una muestra de mucosidad de la nariz (cultivo nasal) para buscar bacterias.  Examen de [place] de mucosidad, para ver si la sinusitis se relaciona con alguna alergia. Si la sinusitis no responde al tratamiento y dura ms de 8semanas, se le podra pedir una resonancia magntica o una tomografa computarizada para examinar los senos paranasales. Estos estudios tambin ayudan a  Office manager gravedad de la infeccin. En contadas ocasiones, se puede ordenar una biopsia de hueso para descartar tipos ms graves de infecciones por hongos en los senos paranasales. TRATAMIENTO El tratamiento para la sinusitis depende de la causa y de si la afeccin es [place]. Si lo que causa la sinusitis es un virus, los sntomas desparecern por s solos en el trmino de 10das. Podran recetarle medicamentos para [employer], entre los que se incluyen los siguientes:  Descongestivos nasales tpicos. [place] y permiten que la mucosidad drene por los senos paranasales.  Antihistamnicos. Este tipo de medicamento bloquea la inflamacin que ocasionan las [place]. Pueden ayudar a reducir la inflamacin en la nariz y los senos.  Corticoides nasales tpicos. Son aerosoles nasales que reducen la inflamacin e hinchazn en la Doran Durand y los senos.  Lavados nasales con solucin salina. Estos  enjuagues pueden ayudar a eliminar la mucosidad espesa en la nariz. Si la afeccin es causada por una bacteria, se le recetarn antibiticos. Si es causada por un hongo, se le recetarn antimicticos. Se podra necesitar ciruga para tratar enfermedades preexistentes, como las fosas nasales estrechas. Tambin podra ser necesaria para eliminar plipos. [place], use o aplquese los medicamentos de venta libre y Editor, commissioning como se lo haya indicado el mdico. Estos pueden incluir aerosoles nasales.  Si le recetaron un antibitico, tmelo como se lo haya indicado el mdico. No deje de tomar los antibiticos aunque comience a Sports administrator. Hidrtese y [place].  Beba suficiente agua para mantener la orina clara o de color amarillo plido. Mantenerse hidratado lo ayudar a [employer].  Use un humidificador de vapor fro para mantener la humedad de su hogar por encima del 50%.  Realice  inhalaciones de vapor por 10 a 37minutos, de 3 a 4veces al da o tal como se lo haya indicado el mdico. Puede hacer esto en el bao con el vapor del agua caliente de la ducha.  Limite la exposicin al aire fro o seco. Reposo  Descanse todo lo que pueda.  Duerma con la cabeza elevada.  Asegrese de dormir lo suficiente cada noche. Instrucciones generales  Aplquese un pao tibio y hmedo en la cara 3 o 4veces al da o como se lo haya indicado el mdico. Esto ayuda a Actor las [place].  Lvese las manos frecuentemente con agua y jabn para reducir la exposicin a virus y otras bacterias. Use desinfectante para manos si no dispone de [place].  No fume. Evite estar cerca de personas que fuman (fumador pasivo).  Concurra a todas las visitas de control como se lo haya indicado el mdico. Esto es importante. SOLICITE ATENCIN MDICA SI:  Jaclynn Guarneri.  Los sntomas empeoran.  Los sntomas no mejoran en el trmino de 10das.  SOLICITE ATENCIN MDICA DE INMEDIATO SI:  Tiene un dolor de cabeza intenso.  Tiene vmitos persistentes.  Tiene dolor o hinchazn en la zona del rostro o los ojos.  Tiene problemas de visin.  Se siente confundido.  Tiene el cuello rgido.  Tiene dificultad para respirar.  Esta informacin no tiene Marine scientist el consejo del mdico. Asegrese de hacerle al mdico cualquier pregunta que tenga. Document Released: 10/05/2004 Document Revised: 04/19/2015 Document Reviewed: 10/21/2014 Elsevier Interactive Patient Education  2017 [place] de cabeza sinusal (Sinus Headache) El dolor de cabeza sinusal ocurre cuando los senos paranasales se taponan o se inflaman. Puede sentir dolor u opresin en el rostro, la frente, los odos o las piezas dentales superiores. Los dolores de cabeza sinusales pueden ser leves o intensos. [place] los medicamentos solamente como se lo haya indicado el mdico.  Si le  indicaron que tome antibiticos, debe terminarlos, incluso si comienza a sentirse mejor.  Aplquese un aerosol nasal si tiene la nariz taponada (congestin).  Si se lo indican, pngase un pao tibio y [employer] rostro para ayudar a Best boy. SOLICITE AYUDA SI:  Tiene dolores de cabeza ms de una vez por semana.  La luz o el ruido [employer].  Tiene fiebre.  Tiene malestar estomacal (nuseas) o vomita.  Los dolores de cabeza no mejoran con Dispensing optician. SOLICITE AYUDA DE INMEDIATO SI:  Tiene dificultad para ver.  Repentinamente, siente un dolor muy intenso en el rostro o  la cabeza.  Empieza a sacudirse o a temblar (convulsiones).  Se siente confundido.  Presenta rigidez en el cuello. Esta informacin no tiene Marine scientist el consejo del mdico. Asegrese de hacerle al mdico cualquier pregunta que tenga. Document Released: 05/30/2010 Document Revised: 05/12/2014 Document Reviewed: 12/22/2013 Elsevier Interactive Patient Education  2018 [place] de cabeza sinusal (Sinus Headache) El dolor de cabeza sinusal ocurre cuando los senos paranasales se taponan o se inflaman. Puede sentir dolor u opresin en el rostro, la frente, los odos o las piezas dentales superiores. Los dolores de cabeza sinusales pueden ser leves o intensos. [place] los medicamentos solamente como se lo haya indicado el mdico.  Si le indicaron que tome antibiticos, debe terminarlos, incluso si comienza a sentirse mejor.  Aplquese un aerosol nasal si tiene la nariz taponada (congestin).  Si se lo indican, pngase un pao tibio y [employer] rostro para ayudar a Best boy. SOLICITE AYUDA SI:  Tiene dolores de cabeza ms de una vez por semana.  La luz o el ruido [employer].  Tiene fiebre.  Tiene malestar estomacal (nuseas) o vomita.  Los dolores de cabeza no mejoran con Dispensing optician. SOLICITE AYUDA DE INMEDIATO SI:  Tiene  dificultad para ver.  Repentinamente, siente un dolor muy intenso en el rostro o la cabeza.  Empieza a sacudirse o a temblar (convulsiones).  Se siente confundido.  Presenta rigidez en el cuello. Esta informacin no tiene Marine scientist el consejo del mdico. Asegrese de hacerle al mdico cualquier pregunta que tenga. Document Released: 05/30/2010 Document Revised: 05/12/2014 Document Reviewed: 12/22/2013 Elsevier Interactive Patient Education  2018 [place] de cabeza sinusal (Sinus Headache) El dolor de cabeza sinusal ocurre cuando los senos paranasales se taponan o se inflaman. Puede sentir dolor u opresin en el rostro, la frente, los odos o las piezas dentales superiores. Los dolores de cabeza sinusales pueden ser leves o intensos. [place] los medicamentos solamente como se lo haya indicado el mdico.  Si le indicaron que tome antibiticos, debe terminarlos, incluso si comienza a sentirse mejor.  Aplquese un aerosol nasal si tiene la nariz taponada (congestin).  Si se lo indican, pngase un pao tibio y [employer] rostro para ayudar a Best boy. SOLICITE AYUDA SI:  Tiene dolores de cabeza ms de una vez por semana.  La luz o el ruido [employer].  Tiene fiebre.  Tiene malestar estomacal (nuseas) o vomita.  Los dolores de cabeza no mejoran con Dispensing optician. SOLICITE AYUDA DE INMEDIATO SI:  Tiene dificultad para ver.  Repentinamente, siente un dolor muy intenso en el rostro o la cabeza.  Empieza a sacudirse o a temblar (convulsiones).  Se siente confundido.  Presenta rigidez en el cuello. Esta informacin no tiene Marine scientist el consejo del mdico. Asegrese de hacerle al mdico cualquier pregunta que tenga. Document Released: 05/30/2010 Document Revised: 05/12/2014 Document Reviewed: 12/22/2013 Elsevier Interactive Patient Education  [employer].

## 2016-11-08 NOTE — Progress Notes (Signed)
CC: Establish care; sinus problem.  HPI: VRI WAS USED TODAY FOR INTERPRETATION Deanna Robles is a 36 y.o. female here today to establish care. She has concerns of a headache today as well as sinus symptoms.   Sinusitis Patient presents with symptoms of sinusitis. Her symptoms include frontal sinus and dental pain, foul breath, cough (which started yesterday), headache, nasal congestion, dizziness. Malodorous green nasal discharge. There has not been a history of sore throats. There has not been a history of chronic otitis media or pharyngotonsillitis.  Prior antibiotic therapy has included Augmentin (2017). Other medications for current symptoms have included ibuprofen and tylenol 500mg  with minimal relief of pain.  She does have history of allergies. She sometimes takes OTC antihistamine for her allergies but not consistently. She  also uses a nasal spray which she reports provides little relief of her symptoms.   Depression She has a history of depression. Currently reports her depression is well controlled. States "I'm not feeling like I felt before. Things are going good". She denies suicidal ideation or homicidal ideation at this time. She denies ever taking any medication for depression or anxiety.  Fibroids She denies any history of fibroids. However she does endorse a history of endometriosis. Denies any pain related to endometriosis. Declines GYN referral.    HEALTH MAINTENACE Declines FLU indefinitely; Reports she became violently ill after receiving the flu vaccine one year and has not had the vaccine administered since then.    ALLERGIES: No Known Allergies  PAST MEDICAL HISTORY: Past Medical History:  Diagnosis Date  . Depression   . Fibroids     SOCIAL HISTORY Social History   Social History  . Marital status: Married    Spouse name: N/A  . Number of children: N/A  . Years of education: N/A   Occupational History  . Not on file.   Social History Main  Topics  . Smoking status: Never Smoker  . Smokeless tobacco: Never Used  . Alcohol use Yes     Comment: occasionally  . Drug use: No  . Sexual activity: Yes    Birth control/ protection: None   Other Topics Concern  . Not on file   Social History Narrative  . No narrative on file    FAMILY HISTORY Family History  Problem Relation Age of Onset  . Diabetes Brother   . Hypertension Mother   . Diabetes Mother   . Hyperlipidemia Mother   . Diabetes Father   . Hyperlipidemia Father   . Hypertension Father      MEDICATIONS AT HOME: Prior to Admission medications   Medication Sig Start Date End Date Taking? Authorizing Provider  ibuprofen (ADVIL,MOTRIN) 800 MG tablet Take 1 tablet (800 mg total) by mouth every 6 (six) hours as needed for cramping. Take with food 12/15/15  Yes Langeland, Dawn T, MD  loratadine (CLARITIN) 10 MG tablet Take 1 tablet (10 mg total) by mouth daily. 09/21/15  Yes Langeland, Dawn T, MD  norgestimate-ethinyl estradiol (ORTHO-CYCLEN,SPRINTEC,PREVIFEM) 0.25-35 MG-MCG tablet Take 1 tablet by mouth daily. 12/15/15  Yes Langeland, Dawn T, MD  amoxicillin-clavulanate (AUGMENTIN) 875-125 MG tablet Take 1 tablet by mouth 2 (two) times daily. Patient not taking: Reported on 12/15/2015 09/21/15   Maren Reamer, MD  benzonatate (TESSALON) 100 MG capsule Take 1-2 capsules (100-200 mg total) by mouth 3 (three) times daily as needed for cough. Patient not taking: Reported on 09/21/2015 04/12/15   Gerre Pebbles, MD  fluticasone [facility]) 50 MCG/ACT nasal spray Place  2 sprays into both nostrils daily. Patient not taking: Reported on 11/08/2016 09/21/15   Maren Reamer, MD  HYDROcodone-homatropine [facility]) 5-1.5 MG/5ML syrup Take 5 mLs by mouth every 12 (twelve) hours as needed for cough. Patient not taking: Reported on 09/21/2015 04/12/15   Gerre Pebbles, MD   Review of Systems  Constitutional: Positive for chills, fever and malaise/fatigue. Negative for weight loss.    HENT: Positive for congestion and sinus pain. Negative for nosebleeds and sore throat.   Eyes: Negative.   Respiratory: Positive for cough. Negative for shortness of breath and wheezing.   Cardiovascular: Negative.  Negative for chest pain, palpitations and claudication.  Gastrointestinal: Negative for nausea and vomiting.  Musculoskeletal: Negative for myalgias.  Skin: Negative.   Neurological: Positive for dizziness and headaches. Negative for tingling, tremors and weakness.  Psychiatric/Behavioral: Negative.        Objective:   Vitals:   11/08/16 1033  BP: 117/78  Pulse: 64  Temp: 98.5 F (36.9 C)  SpO2: 99%   Physical Exam  Constitutional: She is oriented to person, place, and time and well-developed, well-nourished, and in no distress.  HENT:  Head: Normocephalic.  Right Ear: Hearing, tympanic membrane, external ear and ear canal normal.  Left Ear: Hearing, tympanic membrane, external ear and ear canal normal.  Nose: Mucosal edema, rhinorrhea and sinus tenderness present. Right sinus exhibits frontal sinus tenderness. Left sinus exhibits frontal sinus tenderness.  Mouth/Throat: Posterior oropharyngeal edema present.  Significant nasal mucosal edema with erythema and purulent nasal drainage.  Eyes: EOM are normal.  Neck: Normal range of motion. No thyromegaly present.  Cardiovascular: Normal rate, regular rhythm and normal heart sounds.   Pulmonary/Chest: Effort normal and breath sounds normal. No respiratory distress. She has no wheezes. She has no rales. She exhibits no tenderness.  Abdominal: Soft. Bowel sounds are normal.  Lymphadenopathy:    She has no cervical adenopathy.    She has no axillary adenopathy.  Neurological: She is alert and oriented to person, place, and time.  Skin: Skin is warm and dry. No rash noted. No erythema. No pallor.  Psychiatric: Mood, memory, affect and judgment normal.    Lab Results  Component Value Date   WBC 5.7 12/15/2015    HGB 14.2 12/15/2015   HCT 42.4 12/15/2015   MCV 92.6 12/15/2015   PLT 314 12/15/2015   Lab Results  Component Value Date   CREATININE 0.48 (L) 12/15/2015   BUN 10 12/15/2015   NA 138 12/15/2015   K 4.2 12/15/2015   CL 104 12/15/2015   CO2 21 12/15/2015   Lab Results  Component Value Date   HGBA1C 5.2 12/15/2015     Lipid Panel     Component Value Date/Time   CHOL 206 (H) 10/11/2012 1120   TRIG 119 10/11/2012 1120   HDL 44 10/11/2012 1120   CHOLHDL 4.7 10/11/2012 1120   VLDL 24 10/11/2012 1120   LDLCALC 138 (H) 10/11/2012 1120        Assessment and plan:   Deanna Robles was seen today for establish care, medication refill and headache.  Diagnoses and all orders for this visit:  Acute bacterial sinusitis -     amoxicillin-clavulanate (AUGMENTIN) 875-125 MG tablet; Take 1 tablet by mouth 2 (two) times daily.   Obesity BMI (30-39.9) Education provided; nutrition counseling. Reinforced exercise and dietary modifications. Exercise at last 150 minutes per week.  Patient has been counseled extensively about nutrition and exercise. Other issues discussed during this visit include: low  cholesterol diet, weight control with exercise at least 150 minutes per week, foot care, annual eye examinations at Ophthalmology, importance of adherence with medications and regular follow-up.    Return in about 4 weeks (around 12/06/2016) for physical, PAP, fasting labs .  The patient was given clear instructions to go to ER or return to medical center if symptoms don't improve, worsen or new problems develop. The patient verbalized understanding.      Gildardo Pounds, FNP-BC, [facility] [place] [place], [place]

## 2016-12-05 ENCOUNTER — Telehealth: Payer: Self-pay | Admitting: Nurse Practitioner

## 2016-12-05 DIAGNOSIS — R102 Pelvic and perineal pain: Secondary | ICD-10-CM

## 2016-12-05 MED ORDER — NORGESTIMATE-ETH ESTRADIOL 0.25-35 MG-MCG PO TABS: 1.0000 | ORAL_TABLET | Freq: Every day | ORAL | 0 refills | Status: DC

## 2016-12-05 NOTE — Addendum Note (Signed)
Addended by: Rica Mast on: 12/05/2016 12:30 PM   Modules accepted: Orders

## 2016-12-05 NOTE — Telephone Encounter (Signed)
Pt came in to request a refill on  norgestimate-ethinyl estradiol (ORTHO-CYCLEN,SPRINTEC,PREVIFEM) 0.25-35 MG-MCG tablet  Please follow up

## 2016-12-05 NOTE — Telephone Encounter (Signed)
Refilled x 1 package - patient due for PAP with PCP

## 2016-12-06 ENCOUNTER — Encounter: Payer: Self-pay | Admitting: Nurse Practitioner

## 2016-12-18 ENCOUNTER — Encounter: Payer: Self-pay | Admitting: Nurse Practitioner

## 2017-01-26 ENCOUNTER — Ambulatory Visit: Payer: Self-pay | Attending: Nurse Practitioner | Admitting: Nurse Practitioner

## 2017-01-26 ENCOUNTER — Encounter: Payer: Self-pay | Admitting: Nurse Practitioner

## 2017-01-26 VITALS — BP 122/81 | HR 77 | Temp 98.7°F | Ht 63.0 in | Wt 171.2 lb

## 2017-01-26 DIAGNOSIS — Z8249 Family history of ischemic heart disease and other diseases of the circulatory system: Secondary | ICD-10-CM | POA: Insufficient documentation

## 2017-01-26 DIAGNOSIS — Z0000 Encounter for general adult medical examination without abnormal findings: Secondary | ICD-10-CM | POA: Insufficient documentation

## 2017-01-26 DIAGNOSIS — Z833 Family history of diabetes mellitus: Secondary | ICD-10-CM | POA: Insufficient documentation

## 2017-01-26 DIAGNOSIS — R102 Pelvic and perineal pain: Secondary | ICD-10-CM | POA: Insufficient documentation

## 2017-01-26 MED ORDER — NORGESTIMATE-ETH ESTRADIOL 0.25-35 MG-MCG PO TABS: 1.0000 | ORAL_TABLET | Freq: Every day | ORAL | 11 refills | Status: DC

## 2017-01-26 NOTE — Patient Instructions (Addendum)
Endometriosis (Endometriosis) La endometriosis es una enfermedad en la que el tejido que rodea al tero (endometrio) crece fuera de su ubicacin normal. El tejido puede crecer en muchos lugares cerca del tero, pero comnmente crece en los ovarios, las trompas de [place], la vagina o el intestino. Cuando el tero desprende el endometrio en cada ciclo menstrual, hay sangrado en el lugar donde se localiza el tejido endometrial. Esto puede causar dolor porque la sangre es irritante para los tejidos que no estn normalmente expuestos a Librarian, academic. CAUSAS Se desconoce la causa de la endometriosis. FACTORES DE RIESGO Puede tener ms probabilidades de tener endometriosis si:  Tiene antecedentes familiares de endometriosis.  No ha tenido hijos.  Inici su perodo menstrual a los 10aos de edad o menos.  Tiene un alto nivel de estrgeno en el cuerpo.  Estuvo expuesta a cierto medicamento (dietiletilbestrol) antes de Associate Professor (dentro del tero).  Tuvo bajo peso al [employer].  Tiene uno, dos o mltiples hermanos gemelos.  Tiene un [place] inferior a25. El [place] (ndice de masa corporal) es la estimacin de la grasa corporal y se calcula a partir de la altura y [place]. SNTOMAS A menudo, esta afeccin no presenta sntomas. Si tiene sntomas, estos pueden:  Variar segn dnde est creciendo el tejido endometrial.  Ocurrir durante el perodo menstrual (lo ms frecuente) o en la mitad del ciclo.  Ir y venir, o pueden pasar meses sin ningn sntoma.  Desaparecer con la menopausia. Entre los sntomas se pueden incluir los siguientes:  Dolor en la espalda o el abdomen.  Sangrado ms abundante durante los perodos [employer].  [place].  Dolor al defecar.  Infertilidad.  Dolor en la pelvis.  Tener ms de una hemorragia al [employer]. DIAGNSTICO Esta afeccin se diagnostica en funcin de los sntomas y de un examen fsico. Pueden hacerle estudios, por ejemplo:  Anlisis de  [place] y [place]. Estos estudios pueden servir para descartar otras causas posibles de sus sntomas.  Ecografa, para buscar tejidos anormales.  Radiografa del recto (enema de bario).  Una ecografa que se realiza a travs de la vagina (transvaginal).  Tomografa computarizada (TC).  Resonancia magntica (RM).  Una laparoscopia. En este procedimiento, se introduce en el abdomen un instrumento del tamao de un lpiz que tiene una luz, llamado laparoscopio, a travs de una incisin. El laparoscopio le permite al mdico observar los rganos internos y Hydrographic surveyor tejidos anormales para confirmar el diagnstico. Si se descubre la presencia de tejidos anormales, el mdico puede extraer una pequea cantidad de tejido (biopsia) para examinarlo con un microscopio. TRATAMIENTO El tratamiento de esta afeccin puede incluir lo siguiente:  Medicamentos para Best boy, por ejemplo, antiinflamatorios no esteroides (AINE).  Terapia hormonal. Esto significa utilizar hormonas artificiales (sintticas) para reducir Arboriculturist del tejido endometrial. El mdico puede recomendarle usar un mtodo anticonceptivo hormonal u otros medicamentos.  Someterse a [place]. Se puede realizar una ciruga para extraer el tejido endometrial anormal. ? En algunos casos, el tejido se puede extraer utilizando un laparoscopio y Marine scientist (tratamiento laparoscpico con Animal nutritionist). ? [employer] son graves, puede realizarse una ciruga para extirpar las trompas de [place], el tero y los ovarios (histerectoma). [place] los medicamentos de venta libre y los recetados solamente como se lo haya indicado el mdico.  No conduzca ni use maquinaria pesada mientras toma analgsicos recetados.  Trate de evitar actividades que produzcan dolor, incluida la actividad sexual.  Dallie Dad a todas  [place] los medicamentos de venta libre y los recetados solamente como se lo haya indicado el mdico.   No conduzca ni use maquinaria pesada mientras toma analgsicos recetados.   Trate de evitar actividades que produzcan dolor, incluida la actividad sexual.   Concurra a todas las visitas de control como se lo haya indicado el mdico. Esto es importante.  SOLICITE  ATENCIN MDICA SI:   Siente dolor en la zona entre las caderas (regin plvica), que ocurre:  ? Antes, durante o despus del perodo menstrual.  ? En medio del perodo y empeora durante el perodo.  ? Durante o despus de las relaciones sexuales.  ? Al defecar u orinar, especialmente durante el perodo menstrual.   Tiene dificultad para quedar embarazada.   Tiene fiebre.  SOLICITE ATENCIN MDICA DE INMEDIATO SI:   Tiene un dolor intenso que no mejora con medicamentos.   Tiene nuseas y vmitos intensos, o no puede comer sin vomitar.   Siente un dolor que afecta solo la parte inferior derecha del abdomen.   Tiene un dolor abdominal que empeora.   Tiene hinchazn abdominal.   Observa sangre en la materia fecal.  Esta informacin no tiene como fin reemplazar el consejo del mdico. Asegrese de hacerle al mdico cualquier pregunta que tenga.  Document Released: 12/26/2004 Document Revised: 04/19/2015 Document Reviewed: 05/29/2015  Elsevier Interactive Patient Education  2018 Elsevier Inc.

## 2017-01-26 NOTE — Progress Notes (Signed)
Assessment & Plan:  [name] was seen today for annual exam.  Diagnoses and all orders for this visit:  Well woman exam (no gynecological exam) -     CBC -     Basic metabolic panel -     Lipid panel -     VITAMIN D 25 Hydroxy (Vit-D Deficiency, Fractures)  Pelvic pain in female -     norgestimate-ethinyl estradiol (ORTHO-CYCLEN, 28,) 0.25-35 MG-MCG tablet; Take 1 tablet by mouth daily.    Patient has been counseled on age-appropriate routine health concerns for screening and prevention. These are reviewed and up-to-date. Referrals have been placed accordingly. Immunizations are up-to-date or declined.    Subjective:   Chief Complaint  Patient presents with  . Annual Exam    Patient stated she is here for a physical but don't want a pap smear done due to having discomfort due to endometriosis. Patient would like medication refills.    HPI  VRI was used to communicate directly with patient for the entire encounter including providing detailed patient instructions.  Deanna Robles 37 y.o. female presents to office today for well woman exam. She declines PAP and is requesting refills of her OCPs. She states that she takes Ortho-Cyclen for endometriosis. She declines GYN referral today for full workup and evaluation.   Pelvic Pain Ongoing for several years. She has seen GYN in the past and was told she could have endometriosis due to her fibroids and symptoms. She was started on OCPs at that time as well as ibuprofen. She states the OCPs help relieve her pelvic pain but not ibuprofen. She has taken lexapro in the past due to her constant pain. However she reports the Lexapro did not help her mood.    Review of Systems  Constitutional: Negative.  Negative for chills, fever, malaise/fatigue and weight loss.  HENT: Negative.  Negative for congestion, hearing loss, sinus pain and sore throat.   Eyes: Negative.  Negative for blurred vision, double vision, photophobia and pain.    Respiratory: Negative.  Negative for cough, sputum production, shortness of breath and wheezing.   Cardiovascular: Negative.  Negative for chest pain and leg swelling.  Gastrointestinal: Positive for abdominal pain (lower pelvic). Negative for constipation, diarrhea, heartburn, nausea and vomiting.  Genitourinary: Negative.        SEE HPI  Musculoskeletal: Negative.  Negative for joint pain and myalgias.  Skin: Negative.  Negative for rash.  Neurological: Negative.  Negative for dizziness, tremors, speech change, focal weakness, seizures and headaches.  Endo/Heme/Allergies: Negative.  Negative for environmental allergies.  Psychiatric/Behavioral: Negative.  Negative for depression and suicidal ideas. The patient is not nervous/anxious and does not have insomnia.     Past Medical History:  Diagnosis Date  . Depression   . Fibroids     History reviewed. No pertinent surgical history.  Family History  Problem Relation Age of Onset  . Hypertension Mother   . Diabetes Mother   . Hyperlipidemia Mother   . Diabetes Father   . Hyperlipidemia Father   . Hypertension Father   . Diabetes Brother     Social History Reviewed with no changes to be made today.   Outpatient Medications Prior to Visit  Medication Sig Dispense Refill  . ibuprofen (ADVIL,MOTRIN) 800 MG tablet Take 1 tablet (800 mg total) by mouth every 6 (six) hours as needed for cramping. Take with food 60 tablet 1  . loratadine (CLARITIN) 10 MG tablet Take 1 tablet (10 mg total) by mouth  daily. 30 tablet 11  . norgestimate-ethinyl estradiol (ORTHO-CYCLEN,SPRINTEC,PREVIFEM) 0.25-35 MG-MCG tablet Take 1 tablet by mouth daily. 1 Package 0  . fluticasone (FLONASE) 50 MCG/ACT nasal spray Place 2 sprays into both nostrils daily. (Patient not taking: Reported on 11/08/2016) 16 g 6   No facility-administered medications prior to visit.     No Known Allergies     Objective:    BP 122/81 (BP Location: Right Arm, Patient  Position: Sitting, Cuff Size: Normal)   Pulse 77   Temp 98.7 F (37.1 C) (Oral)   Ht 5\' 3"  (1.6 m)   Wt 171 lb 3.2 oz (77.7 kg)   LMP 01/02/2017   SpO2 96%   BMI 30.33 kg/m  Wt Readings from Last 3 Encounters:  01/26/17 171 lb 3.2 oz (77.7 kg)  11/08/16 171 lb 3.2 oz (77.7 kg)  12/15/15 175 lb 9.6 oz (79.7 kg)    Physical Exam  Constitutional: She is oriented to person, place, and time. She appears well-developed and well-nourished.  HENT:  Head: Normocephalic and atraumatic.  Right Ear: External ear normal.  Left Ear: External ear normal.  Nose: Nose normal.  Mouth/Throat: Oropharynx is clear and moist. No oropharyngeal exudate.    Patient has slight drooping of right side of mouth. She states this is genetic and her father's mouth was shaped the same way.   Eyes: Conjunctivae and EOM are normal. Pupils are equal, round, and reactive to light. Right eye exhibits no discharge. Left eye exhibits no discharge. No scleral icterus.  Neck: Normal range of motion. Neck supple. No tracheal deviation present. No thyromegaly present.  Cardiovascular: Normal rate, regular rhythm, normal heart sounds and intact distal pulses. Exam reveals no friction rub.  No murmur heard. Pulmonary/Chest: Effort normal and breath sounds normal. No accessory muscle usage. No respiratory distress. She has no decreased breath sounds. She has no wheezes. She has no rhonchi. She has no rales. She exhibits no tenderness. Right breast exhibits no inverted nipple, no mass, no nipple discharge, no skin change and no tenderness. Left breast exhibits no inverted nipple, no mass, no nipple discharge, no skin change and no tenderness. Breasts are symmetrical.  Abdominal: Soft. Bowel sounds are normal. She exhibits no distension and no mass. There is no hepatosplenomegaly, splenomegaly or hepatomegaly. There is tenderness in the suprapubic area. There is no rigidity, no rebound, no guarding, no CVA tenderness, no tenderness  at McBurney's point and negative Murphy's sign. No hernia. Hernia confirmed negative in the ventral area, confirmed negative in the right inguinal area and confirmed negative in the left inguinal area.  Musculoskeletal: Normal range of motion. She exhibits no edema, tenderness or deformity.  Lymphadenopathy:    She has no cervical adenopathy.  Neurological: She is alert and oriented to person, place, and time. She has normal reflexes. No cranial nerve deficit. Coordination normal.  Skin: Skin is warm and dry. No erythema.  Psychiatric: She has a normal mood and affect. Her speech is normal and behavior is normal. Judgment and thought content normal.       Patient has been counseled extensively about nutrition and exercise as well as the importance of adherence with medications and regular follow-up. The patient was given clear instructions to go to ER or return to medical center if symptoms don't improve, worsen or new problems develop. The patient verbalized understanding.   Follow-up: Return in about 1 year (around 01/26/2018) for FASTING labs and Physical.   Gildardo Pounds, FNP-BC [facility]  and [place], [place]   01/26/2017, 1:56 PM

## 2017-01-27 LAB — CBC
HEMATOCRIT: 44.6 % (ref 34.0–46.6)
Hemoglobin: 14.5 g/dL (ref 11.1–15.9)
MCH: 31 pg (ref 26.6–33.0)
MCHC: 32.5 g/dL (ref 31.5–35.7)
MCV: 95 fL (ref 79–97)
Platelets: 312 10*3/uL (ref 150–379)
RBC: 4.68 x10E6/uL (ref 3.77–5.28)
RDW: 12.9 % (ref 12.3–15.4)
WBC: 6.7 10*3/uL (ref 3.4–10.8)

## 2017-01-27 LAB — BASIC METABOLIC PANEL
BUN / CREAT RATIO: 25 — AB (ref 9–23)
BUN: 14 mg/dL (ref 6–20)
CALCIUM: 9.1 mg/dL (ref 8.7–10.2)
CO2: 23 mmol/L (ref 20–29)
Chloride: 100 mmol/L (ref 96–106)
Creatinine, Ser: 0.55 mg/dL — ABNORMAL LOW (ref 0.57–1.00)
GFR, EST AFRICAN AMERICAN: 139 mL/min/{1.73_m2} (ref >59)
GFR, EST NON AFRICAN AMERICAN: 120 mL/min/{1.73_m2} (ref >59)
Glucose: 90 mg/dL (ref 65–99)
POTASSIUM: 4 mmol/L (ref 3.5–5.2)
Sodium: 138 mmol/L (ref 134–144)

## 2017-01-27 LAB — LIPID PANEL
CHOL/HDL RATIO: 4.2 ratio (ref 0.0–4.4)
Cholesterol, Total: 208 mg/dL — ABNORMAL HIGH (ref 100–199)
HDL: 49 mg/dL (ref >39)
LDL Calculated: 127 mg/dL — ABNORMAL HIGH (ref 0–99)
Triglycerides: 159 mg/dL — ABNORMAL HIGH (ref 0–149)
VLDL Cholesterol Cal: 32 mg/dL (ref 5–40)

## 2017-01-27 LAB — VITAMIN D 25 HYDROXY (VIT D DEFICIENCY, FRACTURES): Vit D, 25-Hydroxy: 14.1 ng/mL — ABNORMAL LOW (ref 30.0–100.0)

## 2017-01-31 ENCOUNTER — Telehealth: Payer: Self-pay

## 2017-01-31 NOTE — Telephone Encounter (Signed)
-----   Message from Gildardo Pounds, NP sent at 01/29/2017  9:38 AM EST ----- Your vitamin D is low. You can take OTC vitamin D 1000units daily to help increase your vitamin D. Your CBC does not show anemia.  Electrolytes and kidney function are normal. Tests show increased cholesterol/ldl and triglyceride levels.  Patient should work on a low fat, heart healthy diet and participate in regular aerobic exercise program to control as well by working out at least 150 minutes per week. No fried foods. No junk foods, sodas, sugary drinks, unhealthy snacking, or smoking.

## 2017-02-02 NOTE — Telephone Encounter (Signed)
-----   Message from Gildardo Pounds, NP sent at 01/29/2017  9:38 AM EST ----- Your vitamin D is low. You can take OTC vitamin D 1000units daily to help increase your vitamin D. Your CBC does not show anemia.  Electrolytes and kidney function are normal. Tests show increased cholesterol/ldl and triglyceride levels.  Patient should work on a low fat, heart healthy diet and participate in regular aerobic exercise program to control as well by working out at least 150 minutes per week. No fried foods. No junk foods, sodas, sugary drinks, unhealthy snacking, or smoking.

## 2017-02-02 NOTE — Telephone Encounter (Signed)
CMA attempt to call patient regarding lab result.   Patient did not answer but allow to leave detail message on [phone].  Spanish interpreter Jacqulyn Bath [phone] left a detail message for patient and call back number.

## 2017-09-26 ENCOUNTER — Ambulatory Visit: Payer: Self-pay | Attending: Family Medicine

## 2017-10-19 ENCOUNTER — Ambulatory Visit: Payer: Self-pay | Admitting: Nurse Practitioner

## 2017-12-24 ENCOUNTER — Ambulatory Visit: Payer: Self-pay | Attending: Nurse Practitioner | Admitting: Nurse Practitioner

## 2017-12-24 ENCOUNTER — Encounter: Payer: Self-pay | Admitting: Nurse Practitioner

## 2017-12-24 VITALS — BP 117/80 | HR 67 | Temp 99.1°F | Resp 16 | Wt 176.8 lb

## 2017-12-24 DIAGNOSIS — Z8249 Family history of ischemic heart disease and other diseases of the circulatory system: Secondary | ICD-10-CM | POA: Insufficient documentation

## 2017-12-24 DIAGNOSIS — Z3041 Encounter for surveillance of contraceptive pills: Secondary | ICD-10-CM | POA: Insufficient documentation

## 2017-12-24 DIAGNOSIS — J0100 Acute maxillary sinusitis, unspecified: Secondary | ICD-10-CM | POA: Insufficient documentation

## 2017-12-24 MED ORDER — NORGESTIMATE-ETH ESTRADIOL 0.25-35 MG-MCG PO TABS: 1.0000 | ORAL_TABLET | Freq: Every day | ORAL | 11 refills | Status: DC

## 2017-12-24 MED ORDER — AMOXICILLIN-POT CLAVULANATE 875-125 MG PO TABS: 1.0000 | ORAL_TABLET | Freq: Two times a day (BID) | ORAL | 0 refills | Status: AC

## 2017-12-24 NOTE — Progress Notes (Signed)
Needs medication refill Sinus pain and pressure

## 2017-12-24 NOTE — Progress Notes (Signed)
Assessment & Plan:  Deanna Robles was seen today for follow-up.  Diagnoses and all orders for this visit:  Acute non-recurrent maxillary sinusitis -     amoxicillin-clavulanate (AUGMENTIN) 875-125 MG tablet; Take 1 tablet by mouth 2 (two) times daily for 7 days.  Encounter for repeat prescription of oral contraceptives -     norgestimate-ethinyl estradiol (ORTHO-CYCLEN, 28,) 0.25-35 MG-MCG tablet; Take 1 tablet by mouth daily.    Patient has been counseled on age-appropriate routine health concerns for screening and prevention. These are reviewed and up-to-date. Referrals have been placed accordingly. Immunizations are up-to-date or declined.    Subjective:   Chief Complaint  Patient presents with  . Follow-up   HPI Deanna Robles 37 y.o. female presents to office today for follow up.  VRI was used to communicate directly with patient for the entire encounter including providing detailed patient instructions. She is requesting a a refill of her oral contraceptives and has complaints today of sinus pain and congestion.   Sinusitis: Patient presents with symptoms of sinusitis. Onset of symptoms 15 days ago.   Her symptoms include nasal congestion, foul breath, headaches, facial pain.  There has not been a history of fevers. There has not been a history of chronic otitis media or pharyngotonsillitis.  Prior antibiotic therapy has included Augmentin. Other medications have included Flonase, Claritin with no relief of symptoms.  She has not had allergy testing in the past.    Contraception Counseling: Patient presents for contraception counseling. She is requesting Ortho Tri Cyclen however the pharmacy here does not offer Ortho Tri Cyclen she requests to have it sent to an alternate pharmacy. States Ortho Tri Cyclen is the only birth control that prevents break through bleeding. I have instructed her again to take her birth control pills as prescribed. She is taking 3 weeks of pills, skips  the placebo and then starts a new pack in order to not have a cycle. She is aware this is not the correct way to take her birth control.  The patient is sexually active. Pertinent past medical history: none.   Review of Systems  Constitutional: Negative for fever, malaise/fatigue and weight loss.  HENT: Positive for congestion, sinus pain and sore throat. Negative for nosebleeds.   Eyes: Negative.  Negative for blurred vision, double vision and photophobia.  Respiratory: Negative.  Negative for cough and shortness of breath.   Cardiovascular: Negative.  Negative for chest pain, palpitations and leg swelling.  Gastrointestinal: Negative.  Negative for heartburn, nausea and vomiting.  Musculoskeletal: Negative.  Negative for myalgias.  Neurological: Positive for headaches. Negative for dizziness, focal weakness and seizures.  Endo/Heme/Allergies: Positive for environmental allergies.  Psychiatric/Behavioral: Negative.  Negative for suicidal ideas.    Past Medical History:  Diagnosis Date  . Depression   . Fibroids     No past surgical history on file.  Family History  Problem Relation Age of Onset  . Hypertension Mother   . Diabetes Mother   . Hyperlipidemia Mother   . Diabetes Father   . Hyperlipidemia Father   . Hypertension Father   . Diabetes Brother     Social History Reviewed with no changes to be made today.   Outpatient Medications Prior to Visit  Medication Sig Dispense Refill  . fluticasone (FLONASE) 50 MCG/ACT nasal spray Place 2 sprays into both nostrils daily. (Patient not taking: Reported on 11/08/2016) 16 g 6  . ibuprofen (ADVIL,MOTRIN) 800 MG tablet Take 1 tablet (800 mg total) by mouth every  6 (six) hours as needed for cramping. Take with food (Patient not taking: Reported on 12/24/2017) 60 tablet 1  . loratadine (CLARITIN) 10 MG tablet Take 1 tablet (10 mg total) by mouth daily. (Patient not taking: Reported on 12/24/2017) 30 tablet 11  . norgestimate-ethinyl  estradiol (ORTHO-CYCLEN, 28,) 0.25-35 MG-MCG tablet Take 1 tablet by mouth daily. (Patient not taking: Reported on 12/24/2017) 1 Package 11   No facility-administered medications prior to visit.     No Known Allergies     Objective:    BP 117/80   Pulse 67   Temp 99.1 F (37.3 C) (Oral)   Resp 16   Wt 176 lb 12.8 oz (80.2 kg)   LMP 12/02/2017   SpO2 99%   BMI 31.32 kg/m  Wt Readings from Last 3 Encounters:  12/24/17 176 lb 12.8 oz (80.2 kg)  01/26/17 171 lb 3.2 oz (77.7 kg)  11/08/16 171 lb 3.2 oz (77.7 kg)    Physical Exam Vitals signs and nursing note reviewed.  Constitutional:      Appearance: She is well-developed.  HENT:     Head: Normocephalic and atraumatic.     Jaw: There is normal jaw occlusion.     Nose: Mucosal edema and rhinorrhea present. Rhinorrhea is purulent.     Right Turbinates: Enlarged and swollen.     Left Turbinates: Enlarged and swollen.     Right Sinus: Maxillary sinus tenderness present. No frontal sinus tenderness.     Left Sinus: Maxillary sinus tenderness present. No frontal sinus tenderness.  Neck:     Musculoskeletal: Normal range of motion.  Cardiovascular:     Rate and Rhythm: Normal rate and regular rhythm.     Heart sounds: Normal heart sounds. No murmur. No friction rub. No gallop.   Pulmonary:     Effort: Pulmonary effort is normal. No tachypnea or respiratory distress.     Breath sounds: Normal breath sounds. No decreased breath sounds, wheezing, rhonchi or rales.  Chest:     Chest wall: No tenderness.  Abdominal:     General: Bowel sounds are normal.     Palpations: Abdomen is soft.  Musculoskeletal: Normal range of motion.  Skin:    General: Skin is warm and dry.  Neurological:     Mental Status: She is alert and oriented to person, place, and time.     Coordination: Coordination normal.  Psychiatric:        Behavior: Behavior normal. Behavior is cooperative.        Thought Content: Thought content normal.         Judgment: Judgment normal.          Patient has been counseled extensively about nutrition and exercise as well as the importance of adherence with medications and regular follow-up. The patient was given clear instructions to go to ER or return to medical center if symptoms don't improve, worsen or new problems develop. The patient verbalized understanding.   Follow-up: No follow-ups on file.   Gildardo Pounds, FNP-BC [facility] and [place], [place]   12/24/2017, 9:48 PM

## 2018-10-28 ENCOUNTER — Other Ambulatory Visit: Payer: Self-pay

## 2018-10-28 ENCOUNTER — Ambulatory Visit: Payer: Self-pay | Attending: Family Medicine

## 2018-11-14 ENCOUNTER — Other Ambulatory Visit: Payer: Self-pay

## 2018-11-14 ENCOUNTER — Ambulatory Visit: Payer: Self-pay | Attending: Family Medicine | Admitting: Physician Assistant

## 2018-11-14 DIAGNOSIS — R102 Pelvic and perineal pain: Secondary | ICD-10-CM

## 2018-11-14 DIAGNOSIS — N809 Endometriosis, unspecified: Secondary | ICD-10-CM

## 2018-11-14 DIAGNOSIS — Z789 Other specified health status: Secondary | ICD-10-CM

## 2018-11-14 DIAGNOSIS — Z3041 Encounter for surveillance of contraceptive pills: Secondary | ICD-10-CM

## 2018-11-14 DIAGNOSIS — Z760 Encounter for issue of repeat prescription: Secondary | ICD-10-CM

## 2018-11-14 MED ORDER — NORGESTIMATE-ETH ESTRADIOL 0.25-35 MG-MCG PO TABS: 1.0000 | ORAL_TABLET | Freq: Every day | ORAL | 11 refills | Status: DC

## 2018-11-14 MED ORDER — IBUPROFEN 800 MG PO TABS
800.0000 mg | ORAL_TABLET | Freq: Four times a day (QID) | ORAL | 1 refills | Status: DC | PRN
Start: 2018-11-14 — End: 2020-08-19

## 2018-11-14 NOTE — Progress Notes (Signed)
Virtual Visit via Telephone Note  I connected with Brookie Stanfill on 11/14/18 at  4:10 PM EST by telephone and verified that I am speaking with the correct person using two identifiers.   I discussed the limitations, risks, security and privacy concerns of performing an evaluation and management service by telephone and the availability of in person appointments. I also discussed with the patient that there may be a patient responsible charge related to this service. The patient expressed understanding and agreed to proceed.  Patient location:  home My Location:  [place] office Persons on the call:  [place] the interpreter, the patient, and me   History of Present Illness: Patient needs RF of OCP.  She has an appt with PCP 12/31/2018 for pap and bloodwork.  Non-smoker.  No h/o PE.  Has h/o endometriosis and needs ibuprofen RF for cramping and pelvic pain associated with that.  No new pelvic pain/fever/vaginal discharge/new symptoms.  I had planned on ordering labs today but she requested deferral since she has an appt next month with her PCP.      Observations/Objective:  NAD.  A&Ox3   Assessment and Plan: 1. Encounter for repeat prescription of oral contraceptives - norgestimate-ethinyl estradiol (ORTHO-CYCLEN, 28,) 0.25-35 MG-MCG tablet; Take 1 tablet by mouth daily.  Dispense: 1 Package; Refill: 11  2. Pelvic pain in female - ibuprofen (ADVIL) 800 MG tablet; Take 1 tablet (800 mg total) by mouth every 6 (six) hours as needed for cramping. Take with food  Dispense: 60 tablet; Refill: 1  3. Endometriosis - norgestimate-ethinyl estradiol (ORTHO-CYCLEN, 28,) 0.25-35 MG-MCG tablet; Take 1 tablet by mouth daily.  Dispense: 1 Package; Refill: 11 - ibuprofen (ADVIL) 800 MG tablet; Take 1 tablet (800 mg total) by mouth every 6 (six) hours as needed for cramping. Take with food  Dispense: 60 tablet; Refill: 1  4. Language barrier stratus interpreters used and additional time  performing visit was required.   Follow Up Instructions: Keep appt for next month with PCP   I discussed the assessment and treatment plan with the patient. The patient was provided an opportunity to ask questions and all were answered. The patient agreed with the plan and demonstrated an understanding of the instructions.   The patient was advised to call back or seek an in-person evaluation if the symptoms worsen or if the condition fails to improve as anticipated.  I provided 11 minutes of non-face-to-face time during this encounter.   Freeman Caldron, PA-C  Patient ID: Gaylan Tausch, female   DOB: 02-23-1980, 38 y.o.   MRN: [id number]

## 2018-12-31 ENCOUNTER — Ambulatory Visit: Payer: Self-pay | Attending: Nurse Practitioner | Admitting: Nurse Practitioner

## 2018-12-31 ENCOUNTER — Encounter: Payer: Self-pay | Admitting: Nurse Practitioner

## 2018-12-31 ENCOUNTER — Other Ambulatory Visit: Payer: Self-pay

## 2018-12-31 VITALS — BP 120/79 | HR 70 | Temp 99.5°F | Ht 63.0 in | Wt 188.0 lb

## 2018-12-31 DIAGNOSIS — R102 Pelvic and perineal pain: Secondary | ICD-10-CM

## 2018-12-31 DIAGNOSIS — Z124 Encounter for screening for malignant neoplasm of cervix: Secondary | ICD-10-CM

## 2018-12-31 DIAGNOSIS — N809 Endometriosis, unspecified: Secondary | ICD-10-CM

## 2018-12-31 NOTE — Progress Notes (Signed)
Assessment & Plan:  Aveya was seen today for gynecologic exam.  Diagnoses and all orders for this visit:  Encounter for Papanicolaou smear for cervical cancer screening -     PAP WITH EVERYTHING -     WET PREP  Pelvic pain in female -     PELVIC COMBINED; Future  Endometriosis -     PELVIC COMBINED; Future    Patient has been counseled on age-appropriate routine health concerns for screening and prevention. These are reviewed and up-to-date. Referrals have been placed accordingly. Immunizations are up-to-date or declined.    Subjective:   Chief Complaint  Patient presents with  . Gynecologic Exam    PAP smear   HPI Deanna Robles 38 y.o. female presents to office today for pap.  She endorses chronic pelvic pain worse with menstrual cycle.  She does have a history of endometriosis and is taking oral contraceptives to help control symptoms.  Also pelvic ultrasound in 2015 did show a 1.7 cm intramural anterior fundus fibroid.  Will reorder pelvic ultrasound today to evaluate for worsening endometriosis or enlarging fibroid.  Review of Systems  Constitutional: Negative.  Negative for chills, fever, malaise/fatigue and weight loss.  Respiratory: Negative.  Negative for cough, shortness of breath and wheezing.   Cardiovascular: Negative.  Negative for chest pain, orthopnea and leg swelling.  Gastrointestinal: Negative for abdominal pain.  Genitourinary: Negative for dysuria, flank pain, frequency and urgency.       Lower pelvic pain  Skin: Negative.  Negative for rash.  Psychiatric/Behavioral: Negative for suicidal ideas.    Past Medical History:  Diagnosis Date  . Depression   . Fibroids     History reviewed. No pertinent surgical history.  Family History  Problem Relation Age of Onset  . Hypertension Mother   . Diabetes Mother   . Hyperlipidemia Mother   . Diabetes Father   . Hyperlipidemia Father   . Hypertension Father   . Diabetes Brother      Social History Reviewed with no changes to be made today.   Outpatient Medications Prior to Visit  Medication Sig Dispense Refill  . ibuprofen (ADVIL) 800 MG tablet Take 1 tablet (800 mg total) by mouth every 6 (six) hours as needed for cramping. Take with food 60 tablet 1  . norgestimate-ethinyl estradiol (ORTHO-CYCLEN, 28,) 0.25-35 MG-MCG tablet Take 1 tablet by mouth daily. 1 Package 11  . fluticasone (FLONASE) 50 MCG/ACT nasal spray Place 2 sprays into both nostrils daily. (Patient not taking: Reported on 11/08/2016) 16 g 6  . loratadine (CLARITIN) 10 MG tablet Take 1 tablet (10 mg total) by mouth daily. (Patient not taking: Reported on 12/24/2017) 30 tablet 11   No facility-administered medications prior to visit.    No Known Allergies     Objective:    BP 120/79 (BP Location: Right Arm, Patient Position: Sitting, Cuff Size: Normal)   Pulse 70   Temp 99.5 F (37.5 C) (Oral)   Ht 5\' 3"  (1.6 m)   Wt 188 lb (85.3 kg)   SpO2 97%   BMI 33.30 kg/m  Wt Readings from Last 3 Encounters:  12/31/18 188 lb (85.3 kg)  12/24/17 176 lb 12.8 oz (80.2 kg)  01/26/17 171 lb 3.2 oz (77.7 kg)    Physical Exam Exam conducted with a chaperone present.  Constitutional:      Appearance: She is well-developed.  HENT:     Head: Normocephalic.  Cardiovascular:     Rate and Rhythm: Normal rate  and regular rhythm.     Heart sounds: Normal heart sounds.  Pulmonary:     Effort: Pulmonary effort is normal.     Breath sounds: Normal breath sounds.  Abdominal:     General: Bowel sounds are normal.     Palpations: Abdomen is soft.     Tenderness: There is abdominal tenderness in the suprapubic area.     Hernia: There is no hernia in the left inguinal area.  Genitourinary:    Exam position: Lithotomy position.     Labia:        Right: No rash, tenderness, lesion or injury.        Left: No rash, tenderness, lesion or injury.      Vagina: Normal. No signs of injury and foreign body. No  vaginal discharge, erythema, tenderness or bleeding.     Cervix: Discharge present. No cervical motion tenderness or friability.     Uterus: Not deviated and not enlarged.      Adnexa:        Right: No mass, tenderness or fullness.         Left: No mass, tenderness or fullness.       Rectum: Normal. No external hemorrhoid.  Lymphadenopathy:     Lower Body: No right inguinal adenopathy. No left inguinal adenopathy.  Skin:    General: Skin is warm and dry.  Neurological:     Mental Status: She is alert and oriented to person, place, and time.  Psychiatric:        Behavior: Behavior normal.        Thought Content: Thought content normal.        Judgment: Judgment normal.          Patient has been counseled extensively about nutrition and exercise as well as the importance of adherence with medications and regular follow-up. The patient was given clear instructions to go to ER or return to medical center if symptoms don't improve, worsen or new problems develop. The patient verbalized understanding.   Follow-up: Return if symptoms worsen or fail to improve.   Gildardo Pounds, FNP-BC [facility] and [place], [place]   12/31/2018, 1:18 PM

## 2019-01-01 LAB — CERVICOVAGINAL ANCILLARY ONLY
Bacterial Vaginitis (gardnerella): NEGATIVE
Candida Glabrata: NEGATIVE
Candida Vaginitis: NEGATIVE
Chlamydia: NEGATIVE
Comment: NEGATIVE
Comment: NEGATIVE
Comment: NEGATIVE
Comment: NEGATIVE
Comment: NEGATIVE
Comment: NORMAL
Neisseria Gonorrhea: NEGATIVE
Trichomonas: NEGATIVE

## 2019-01-01 LAB — CYTOLOGY - PAP
Comment: NEGATIVE
Diagnosis: NEGATIVE
High risk HPV: NEGATIVE

## 2019-01-06 ENCOUNTER — Ambulatory Visit (HOSPITAL_COMMUNITY)
Admission: RE | Admit: 2019-01-06 | Discharge: 2019-01-06 | Disposition: A | Discharge status: Home / Self Care | Payer: Self-pay | Source: Ambulatory Visit | Attending: Nurse Practitioner | Admitting: Nurse Practitioner

## 2019-01-06 ENCOUNTER — Other Ambulatory Visit: Payer: Self-pay

## 2019-01-06 DIAGNOSIS — R102 Pelvic and perineal pain: Secondary | ICD-10-CM | POA: Insufficient documentation

## 2019-01-06 DIAGNOSIS — N809 Endometriosis, unspecified: Secondary | ICD-10-CM | POA: Insufficient documentation

## 2019-01-15 ENCOUNTER — Telehealth: Payer: Self-pay | Admitting: *Deleted

## 2019-01-15 NOTE — Telephone Encounter (Signed)
-----   Message from Gildardo Pounds, NP sent at 01/13/2019  1:04 AM EST ----- Your [name] does show a fibroid which has increased in size from 5 years ago. If you are still experiencing pelvic pain I can refer you to women's health for further recommendations. Please let us know

## 2019-01-15 NOTE — Telephone Encounter (Signed)
Patient verified DOB Patient is aware of US showing an increase in size of the fibroid. Patient does still complain of pain and is aware of a referral being placed to GYN for further evaluation. Patient states she has an appointment in February with financial to complete the CAFA/GCCN.

## 2019-02-10 ENCOUNTER — Encounter: Payer: Self-pay | Admitting: Nurse Practitioner

## 2019-02-10 ENCOUNTER — Ambulatory Visit: Payer: Self-pay | Attending: Nurse Practitioner | Admitting: Nurse Practitioner

## 2019-02-10 ENCOUNTER — Other Ambulatory Visit: Payer: Self-pay

## 2019-02-10 VITALS — BP 108/70 | HR 66 | Temp 98.4°F | Ht 63.0 in | Wt 193.0 lb

## 2019-02-10 DIAGNOSIS — R102 Pelvic and perineal pain: Secondary | ICD-10-CM

## 2019-02-10 DIAGNOSIS — Z0000 Encounter for general adult medical examination without abnormal findings: Secondary | ICD-10-CM

## 2019-02-10 DIAGNOSIS — N6320 Unspecified lump in the left breast, unspecified quadrant: Secondary | ICD-10-CM

## 2019-02-10 NOTE — Progress Notes (Signed)
Assessment & Plan:  Deanna Robles was seen today for annual exam.  Diagnoses and all orders for this visit:  Encounter for annual physical exam -     Hemoglobin A1c -     CBC -     CMP14+EGFR -     Lipid panel -     VITAMIN D 25 Hydroxy (Vit-D Deficiency, Fractures)  Pelvic pain -     Ambulatory referral to Gynecology  Left breast mass Referred to women's clinic for PAP and Ultrasound/mammogram She states she has the orange card/financial assistance program  Patient has been counseled on age-appropriate routine health concerns for screening and prevention. These are reviewed and up-to-date. Referrals have been placed accordingly. Immunizations are up-to-date or declined.    Subjective:   Chief Complaint  Patient presents with  . Annual Exam   HPI Deanna Robles 39 y.o. female presents to office today for annual physical.   She is still experiencing chronic pelvic pain. Aggravating factors: menstrual cycle. She states that she does have a history of endometriosis and is taking oral contraceptives to help control symptoms.    Pelvic ultrasound in 2015 did show a 1.7 cm intramural anterior fundus fibroid.   Pelvic ultrasound January 06, 2019 showed stable 1.8 cm uterine fibroid and 2 simple right ovarian follicular cyst with the largest measuring 2.8 cm and probable nabothian cyst measuring 1.5 cm Will place referral to the women's clinic.   Review of Systems  Constitutional: Negative for fever, malaise/fatigue and weight loss.  HENT: Negative.  Negative for nosebleeds.   Eyes: Negative.  Negative for blurred vision, double vision, photophobia, pain, discharge and redness.  Respiratory: Negative.  Negative for cough and shortness of breath.   Cardiovascular: Negative.  Negative for chest pain, palpitations and leg swelling.  Gastrointestinal: Negative.  Negative for heartburn, nausea and vomiting.  Genitourinary: Negative for dysuria, flank pain, frequency,  hematuria and urgency.       Chronic pelvic pain  Musculoskeletal: Negative.  Negative for myalgias.  Skin: Negative.   Neurological: Negative.  Negative for dizziness, focal weakness, seizures and headaches.  Endo/Heme/Allergies: Negative.   Psychiatric/Behavioral: Positive for depression. Negative for suicidal ideas.    Past Medical History:  Diagnosis Date  . Depression   . Fibroids     History reviewed. No pertinent surgical history.  Family History  Problem Relation Age of Onset  . Hypertension Mother   . Diabetes Mother   . Hyperlipidemia Mother   . Diabetes Father   . Hyperlipidemia Father   . Hypertension Father   . Diabetes Brother     Social History Reviewed with no changes to be made today.   Outpatient Medications Prior to Visit  Medication Sig Dispense Refill  . ibuprofen (ADVIL) 800 MG tablet Take 1 tablet (800 mg total) by mouth every 6 (six) hours as needed for cramping. Take with food 60 tablet 1  . norgestimate-ethinyl estradiol (ORTHO-CYCLEN, 28,) 0.25-35 MG-MCG tablet Take 1 tablet by mouth daily. 1 Package 11  . fluticasone (FLONASE) 50 MCG/ACT nasal spray Place 2 sprays into both nostrils daily. (Patient not taking: Reported on 11/08/2016) 16 g 6  . loratadine (CLARITIN) 10 MG tablet Take 1 tablet (10 mg total) by mouth daily. (Patient not taking: Reported on 12/24/2017) 30 tablet 11   No facility-administered medications prior to visit.    No Known Allergies     Objective:    BP 108/70 (BP Location: Left Arm, Patient Position: Sitting, Cuff Size: Normal)  Pulse 66   Temp 98.4 F (36.9 C) (Oral)   Ht '5\' 3"'$  (1.6 m)   Wt 193 lb (87.5 kg)   SpO2 97%   BMI 34.19 kg/m  Wt Readings from Last 3 Encounters:  02/10/19 193 lb (87.5 kg)  12/31/18 188 lb (85.3 kg)  12/24/17 176 lb 12.8 oz (80.2 kg)    Physical Exam Constitutional:      Appearance: She is well-developed.  HENT:     Head: Normocephalic and atraumatic.     Right Ear: Tympanic  membrane, ear canal and external ear normal.     Left Ear: Tympanic membrane, ear canal and external ear normal.     Nose: Nose normal.     Mouth/Throat:     Pharynx: No oropharyngeal exudate.  Eyes:     General: Lids are normal. No scleral icterus.       Right eye: No discharge.     Extraocular Movements: Extraocular movements intact.     Conjunctiva/sclera: Conjunctivae normal.     Pupils: Pupils are equal, round, and reactive to light.  Neck:     Thyroid: No thyromegaly.     Trachea: No tracheal deviation.  Cardiovascular:     Rate and Rhythm: Normal rate and regular rhythm.     Heart sounds: Normal heart sounds. No murmur. No friction rub.  Pulmonary:     Effort: Pulmonary effort is normal. No accessory muscle usage or respiratory distress.     Breath sounds: Normal breath sounds. No decreased breath sounds, wheezing, rhonchi or rales.  Chest:     Chest wall: No tenderness.     Breasts: Breasts are symmetrical.        Right: No swelling, bleeding, inverted nipple, mass, nipple discharge, skin change or tenderness.        Left: Inverted nipple, mass and tenderness present. No swelling, bleeding, nipple discharge or skin change.    Abdominal:     General: Bowel sounds are normal. There is no distension.     Palpations: Abdomen is soft. There is no mass.     Tenderness: There is abdominal tenderness in the right lower quadrant and left lower quadrant. There is no right CVA tenderness, left CVA tenderness, guarding or rebound. Negative signs include Murphy's sign, Rovsing's sign, McBurney's sign, psoas sign and obturator sign.     Hernia: No hernia is present.  Musculoskeletal:        General: No tenderness or deformity. Normal range of motion.     Cervical back: Normal range of motion and neck supple.  Lymphadenopathy:     Cervical: No cervical adenopathy.  Skin:    General: Skin is warm and dry.     Findings: No erythema.  Neurological:     Mental Status: She is alert and  oriented to person, place, and time.     Cranial Nerves: Cranial nerves are intact. No cranial nerve deficit.     Sensory: Sensation is intact.     Motor: Motor function is intact.     Coordination: Coordination is intact. Coordination normal.     Gait: Gait is intact.     Deep Tendon Reflexes:     Reflex Scores:      Patellar reflexes are 1+ on the right side and 1+ on the left side. Psychiatric:        Speech: Speech normal.        Behavior: Behavior normal.        Thought Content: Thought content normal.  Judgment: Judgment normal.          Patient has been counseled extensively about nutrition and exercise as well as the importance of adherence with medications and regular follow-up. The patient was given clear instructions to go to ER or return to medical center if symptoms don't improve, worsen or new problems develop. The patient verbalized understanding.   Follow-up: Return if symptoms worsen or fail to improve.   Gildardo Pounds, FNP-BC [facility] and [place], [place]   02/10/2019, 1:42 PM

## 2019-02-11 ENCOUNTER — Other Ambulatory Visit: Payer: Self-pay | Admitting: Nurse Practitioner

## 2019-02-11 LAB — CMP14+EGFR
ALT: 19 IU/L (ref 0–32)
AST: 16 IU/L (ref 0–40)
Albumin/Globulin Ratio: 1.6 (ref 1.2–2.2)
Albumin: 4.2 g/dL (ref 3.8–4.8)
Alkaline Phosphatase: 68 IU/L (ref 39–117)
BUN/Creatinine Ratio: 16 (ref 9–23)
BUN: 9 mg/dL (ref 6–20)
Bilirubin Total: 0.3 mg/dL (ref 0.0–1.2)
CO2: 23 mmol/L (ref 20–29)
Calcium: 9.3 mg/dL (ref 8.7–10.2)
Chloride: 104 mmol/L (ref 96–106)
Creatinine, Ser: 0.57 mg/dL (ref 0.57–1.00)
GFR calc Af Amer: 135 mL/min/{1.73_m2} (ref >59)
GFR calc non Af Amer: 117 mL/min/{1.73_m2} (ref >59)
Globulin, Total: 2.6 g/dL (ref 1.5–4.5)
Glucose: 96 mg/dL (ref 65–99)
Potassium: 4.6 mmol/L (ref 3.5–5.2)
Sodium: 140 mmol/L (ref 134–144)
Total Protein: 6.8 g/dL (ref 6.0–8.5)

## 2019-02-11 LAB — CBC
Hematocrit: 41.5 % (ref 34.0–46.6)
Hemoglobin: 13.9 g/dL (ref 11.1–15.9)
MCH: 31.2 pg (ref 26.6–33.0)
MCHC: 33.5 g/dL (ref 31.5–35.7)
MCV: 93 fL (ref 79–97)
Platelets: 337 10*3/uL (ref 150–450)
RBC: 4.46 x10E6/uL (ref 3.77–5.28)
RDW: 12 % (ref 11.7–15.4)
WBC: 7.6 10*3/uL (ref 3.4–10.8)

## 2019-02-11 LAB — LIPID PANEL
Chol/HDL Ratio: 4.1 ratio (ref 0.0–4.4)
Cholesterol, Total: 198 mg/dL (ref 100–199)
HDL: 48 mg/dL (ref >39)
LDL Chol Calc (NIH): 115 mg/dL — ABNORMAL HIGH (ref 0–99)
Triglycerides: 200 mg/dL — ABNORMAL HIGH (ref 0–149)
VLDL Cholesterol Cal: 35 mg/dL (ref 5–40)

## 2019-02-11 LAB — VITAMIN D 25 HYDROXY (VIT D DEFICIENCY, FRACTURES): Vit D, 25-Hydroxy: 18 ng/mL — ABNORMAL LOW (ref 30.0–100.0)

## 2019-02-11 LAB — HEMOGLOBIN A1C
Est. average glucose Bld gHb Est-mCnc: 105 mg/dL
Hgb A1c MFr Bld: 5.3 % (ref 4.8–5.6)

## 2019-02-11 MED ORDER — VITAMIN D (ERGOCALCIFEROL) 1.25 MG (50000 UNIT) PO CAPS: 50000.0000 [IU] | ORAL_CAPSULE | ORAL | 1 refills | Status: DC

## 2019-02-11 MED ORDER — ATORVASTATIN CALCIUM 10 MG PO TABS: 10.0000 mg | ORAL_TABLET | Freq: Every day | ORAL | 3 refills | Status: AC

## 2019-02-12 ENCOUNTER — Other Ambulatory Visit (HOSPITAL_COMMUNITY): Payer: Self-pay

## 2019-02-12 DIAGNOSIS — N6452 Nipple discharge: Secondary | ICD-10-CM

## 2019-02-12 DIAGNOSIS — N6320 Unspecified lump in the left breast, unspecified quadrant: Secondary | ICD-10-CM

## 2019-02-18 ENCOUNTER — Other Ambulatory Visit: Payer: Self-pay

## 2019-02-18 ENCOUNTER — Ambulatory Visit: Payer: Self-pay | Attending: Nurse Practitioner

## 2019-02-27 ENCOUNTER — Ambulatory Visit: Payer: Self-pay

## 2019-02-27 ENCOUNTER — Other Ambulatory Visit: Payer: Self-pay

## 2019-03-11 ENCOUNTER — Other Ambulatory Visit: Payer: Self-pay

## 2019-03-11 ENCOUNTER — Ambulatory Visit: Payer: Self-pay

## 2019-03-14 ENCOUNTER — Other Ambulatory Visit: Payer: Self-pay

## 2019-03-14 ENCOUNTER — Ambulatory Visit (INDEPENDENT_AMBULATORY_CARE_PROVIDER_SITE_OTHER): Payer: Self-pay | Admitting: Obstetrics and Gynecology

## 2019-03-14 ENCOUNTER — Encounter: Payer: Self-pay | Admitting: Obstetrics and Gynecology

## 2019-03-14 DIAGNOSIS — R102 Pelvic and perineal pain: Secondary | ICD-10-CM

## 2019-03-14 DIAGNOSIS — N3090 Cystitis, unspecified without hematuria: Secondary | ICD-10-CM | POA: Insufficient documentation

## 2019-03-14 DIAGNOSIS — D259 Leiomyoma of uterus, unspecified: Secondary | ICD-10-CM

## 2019-03-14 MED ORDER — DESOGESTREL-ETHINYL ESTRADIOL 0.15-30 MG-MCG PO TABS: 1.0000 | ORAL_TABLET | Freq: Every day | ORAL | 11 refills | Status: DC

## 2019-03-14 MED ORDER — NITROFURANTOIN MONOHYD MACRO 100 MG PO CAPS: 100.0000 mg | ORAL_CAPSULE | Freq: Two times a day (BID) | ORAL | 0 refills | Status: DC

## 2019-03-14 NOTE — Progress Notes (Signed)
Ms Deanna Robles presents in referral for pelvic pain. ? Dx of endometriosis due to her having painful cycles. Pain has been somewhat controlled with OCP's and Motrin. She reports her cycles are regular now and cramps not as bad. But still has pelvic cramps 2-3 times a week even when not on her cycles. Motrin helps some. She denies bladder dysfunction but does have some problems with constipation. Does not relate constipation with her pain. Pain with intercourse Pap UTD H/O uterine fibroid, stable and no change in size on last U/S, < 2 cm  TSVD x 2  PE AF VSS Lungs clear Heart RRR Abd soft + BS GU nl EGBUS, normal discharge, cervix no lesions, uterus small mobile non tender, no adnexal masses, + bladder tenderness  A/P Pelvic Pain        Uterine Fibroid        Cystitis  No sure pt has endometriosis or just dysmenorrhea and chronic cystitis. Will change OCP's and pt instructed to take active pills continually. Pt informed she will not have a cycle and or cycle may be irregular at first. Will check UC. Tx cystitis with Macrobid. Doubt that the small fibroid is causing the pt's pain.  Will follow up in 6 weeks.  Live interrupter used during today's visit

## 2019-03-15 LAB — URINE CULTURE

## 2019-03-20 ENCOUNTER — Ambulatory Visit: Payer: Self-pay | Admitting: Student

## 2019-03-20 ENCOUNTER — Other Ambulatory Visit: Payer: Self-pay

## 2019-03-20 VITALS — BP 122/78 | Temp 98.6°F | Wt 194.0 lb

## 2019-03-20 DIAGNOSIS — Z1239 Encounter for other screening for malignant neoplasm of breast: Secondary | ICD-10-CM

## 2019-03-20 NOTE — Progress Notes (Signed)
Ms. Deanna Robles is a 39 y.o. female who presents to [facility] clinic today with complaint of left breast pain and sour milk discharge. The pain comes and goes, it seems to happen during the middle of the month, about two weeks before her period.  Sometimes her breast leaks without expression.    Pap Smear: Pap not smear completed today. Last Pap smear was [facility] on 12/22 and was  normal. Per patient has no history of an abnormal Pap smear. Last Pap smear result is available in Epic.   Physical exam: Breasts Breasts symmetrical. No skin abnormalities bilateral breasts. No nipple retraction bilateral breasts. No nipple discharge bilateral breasts. No lymphadenopathy. No lumps palpated bilateral breasts.       Pelvic/Bimanual Pap is not indicated today    Smoking History: Patient has never smoked   Patient Navigation: Patient education provided. Access to services provided for patient through [place] interpreter provided.    Colorectal Cancer Screening: Per patient has never had colonoscopy completed No complaints today.    Breast and Cervical Cancer Risk Assessment: Patient does not have family history of breast cancer, known genetic mutations, or radiation treatment to the chest before age 62. Patient does not have history of cervical dysplasia, immunocompromised, or DES exposure in-utero.  Risk Assessment    Risk Scores      03/20/2019   Last edited by: Demetrius Revel, LPN   5-year risk: 0.3 %   Lifetime risk: 5.8 %          A: BCCCP exam without pap smear Complaint of left leaking of breast discharge  P: Referred patient to the [facility] for a diagnostic mammogram. Appointment scheduled for March 25, 2019.   Deanna Robles, [place] 03/20/2019 3:33 PM

## 2019-03-21 ENCOUNTER — Telehealth: Payer: Self-pay | Admitting: *Deleted

## 2019-03-21 NOTE — Telephone Encounter (Addendum)
Message left on nurse VM that pt has questions about her medication. She requests a call back with a Spanish interpreter.   [phone] Call returned to pt w/Spanish interpreter [place] [phone]. Pt had questions regarding Rx for Macrobid. She wanted to know if she had a urinary tract infection. I stated that the test did not show that she definitively had an infection, however the doctor was also treating her for an inflammation of the urethra (cystitis). Pt expressed concern that this is a strong medication and could damage her liver. I advised that she will not be taking the medication on a long term basis and that Dr. Rip Harbour would not prescribe anything which would be harmful to her. Pt voiced understanding and stated that she will take the medication as prescribed. Pt will be contacted with a follow up appt as requested by Dr. Rip Harbour.

## 2019-03-25 ENCOUNTER — Ambulatory Visit
Admission: RE | Admit: 2019-03-25 | Discharge: 2019-03-25 | Disposition: A | Discharge status: Home / Self Care | Payer: No Typology Code available for payment source | Source: Ambulatory Visit | Attending: Obstetrics and Gynecology | Admitting: Obstetrics and Gynecology

## 2019-03-25 ENCOUNTER — Other Ambulatory Visit: Payer: Self-pay

## 2019-03-25 DIAGNOSIS — N6452 Nipple discharge: Secondary | ICD-10-CM

## 2019-03-25 DIAGNOSIS — N6320 Unspecified lump in the left breast, unspecified quadrant: Secondary | ICD-10-CM

## 2019-05-01 ENCOUNTER — Other Ambulatory Visit: Payer: Self-pay

## 2019-05-01 ENCOUNTER — Ambulatory Visit (INDEPENDENT_AMBULATORY_CARE_PROVIDER_SITE_OTHER): Payer: Self-pay | Admitting: Obstetrics and Gynecology

## 2019-05-01 ENCOUNTER — Encounter: Payer: Self-pay | Admitting: Obstetrics and Gynecology

## 2019-05-01 VITALS — BP 116/80 | HR 76 | Ht 65.0 in | Wt 190.0 lb

## 2019-05-01 DIAGNOSIS — N3090 Cystitis, unspecified without hematuria: Secondary | ICD-10-CM

## 2019-05-01 DIAGNOSIS — R102 Pelvic and perineal pain: Secondary | ICD-10-CM

## 2019-05-01 DIAGNOSIS — N946 Dysmenorrhea, unspecified: Secondary | ICD-10-CM | POA: Insufficient documentation

## 2019-05-01 NOTE — Progress Notes (Signed)
Deanna Robles is here for follow up appt.  She has two days left of the Macrobid. She reports feeling better no more pelvic pressure or pain with intercourse.  She has been taking Apri continuous. She has had some BTB and started a cycle on 4/19 while on the 3rd row of active pills. Cycle pain has improved.  PE AF VSS Lungs clear Heart RRR Abd soft + BS  A/P Dysmenorrhea        Chronic cystitis It would appear that the cystitis has resolved. Discussed options for dysmenorrhea. She desires to have a monthly cycle. Will continue with Apri but stop continuous route. F/U in 3 months. Live interrupter used during today's visit

## 2020-01-23 ENCOUNTER — Ambulatory Visit: Payer: Self-pay | Attending: Nurse Practitioner | Admitting: Nurse Practitioner

## 2020-01-23 ENCOUNTER — Other Ambulatory Visit: Payer: Self-pay

## 2020-02-03 ENCOUNTER — Other Ambulatory Visit: Payer: Self-pay

## 2020-02-03 ENCOUNTER — Ambulatory Visit: Payer: Self-pay | Attending: Nurse Practitioner

## 2020-02-03 ENCOUNTER — Other Ambulatory Visit: Payer: Self-pay | Admitting: Nurse Practitioner

## 2020-02-03 ENCOUNTER — Other Ambulatory Visit: Payer: No Typology Code available for payment source

## 2020-02-03 DIAGNOSIS — E559 Vitamin D deficiency, unspecified: Secondary | ICD-10-CM

## 2020-02-03 DIAGNOSIS — Z130 Encounter for screening for diseases of the blood and blood-forming organs and certain disorders involving the immune mechanism: Secondary | ICD-10-CM

## 2020-02-03 DIAGNOSIS — Z13228 Encounter for screening for other metabolic disorders: Secondary | ICD-10-CM

## 2020-02-03 DIAGNOSIS — Z131 Encounter for screening for diabetes mellitus: Secondary | ICD-10-CM

## 2020-02-03 DIAGNOSIS — E785 Hyperlipidemia, unspecified: Secondary | ICD-10-CM

## 2020-02-04 ENCOUNTER — Ambulatory Visit: Payer: Self-pay | Attending: Nurse Practitioner

## 2020-02-04 ENCOUNTER — Other Ambulatory Visit: Payer: Self-pay

## 2020-02-04 LAB — CBC
Hematocrit: 43.3 % (ref 34.0–46.6)
Hemoglobin: 14.8 g/dL (ref 11.1–15.9)
MCH: 31 pg (ref 26.6–33.0)
MCHC: 34.2 g/dL (ref 31.5–35.7)
MCV: 91 fL (ref 79–97)
Platelets: 295 10*3/uL (ref 150–450)
RBC: 4.77 x10E6/uL (ref 3.77–5.28)
RDW: 11.9 % (ref 11.7–15.4)
WBC: 6.2 10*3/uL (ref 3.4–10.8)

## 2020-02-04 LAB — CMP14+EGFR
ALT: 38 IU/L — ABNORMAL HIGH (ref 0–32)
AST: 27 IU/L (ref 0–40)
Albumin/Globulin Ratio: 1.4 (ref 1.2–2.2)
Albumin: 4.4 g/dL (ref 3.8–4.8)
Alkaline Phosphatase: 108 IU/L (ref 44–121)
BUN/Creatinine Ratio: 21 (ref 9–23)
BUN: 14 mg/dL (ref 6–24)
Bilirubin Total: 0.4 mg/dL (ref 0.0–1.2)
CO2: 21 mmol/L (ref 20–29)
Calcium: 9.6 mg/dL (ref 8.7–10.2)
Chloride: 101 mmol/L (ref 96–106)
Creatinine, Ser: 0.67 mg/dL (ref 0.57–1.00)
GFR calc Af Amer: 127 mL/min/{1.73_m2} (ref >59)
GFR calc non Af Amer: 110 mL/min/{1.73_m2} (ref >59)
Globulin, Total: 3.1 g/dL (ref 1.5–4.5)
Glucose: 97 mg/dL (ref 65–99)
Potassium: 3.9 mmol/L (ref 3.5–5.2)
Sodium: 136 mmol/L (ref 134–144)
Total Protein: 7.5 g/dL (ref 6.0–8.5)

## 2020-02-04 LAB — LIPID PANEL
Chol/HDL Ratio: 5.1 ratio — ABNORMAL HIGH (ref 0.0–4.4)
Cholesterol, Total: 247 mg/dL — ABNORMAL HIGH (ref 100–199)
HDL: 48 mg/dL (ref >39)
LDL Chol Calc (NIH): 181 mg/dL — ABNORMAL HIGH (ref 0–99)
Triglycerides: 103 mg/dL (ref 0–149)
VLDL Cholesterol Cal: 18 mg/dL (ref 5–40)

## 2020-02-04 LAB — HEMOGLOBIN A1C
Est. average glucose Bld gHb Est-mCnc: 111 mg/dL
Hgb A1c MFr Bld: 5.5 % (ref 4.8–5.6)

## 2020-02-04 LAB — VITAMIN D 25 HYDROXY (VIT D DEFICIENCY, FRACTURES): Vit D, 25-Hydroxy: 17.3 ng/mL — ABNORMAL LOW (ref 30.0–100.0)

## 2020-02-10 ENCOUNTER — Other Ambulatory Visit: Payer: Self-pay | Admitting: Nurse Practitioner

## 2020-02-10 MED ORDER — VITAMIN D (ERGOCALCIFEROL) 1.25 MG (50000 UNIT) PO CAPS: 50000.0000 [IU] | ORAL_CAPSULE | ORAL | 1 refills | Status: AC

## 2020-03-02 ENCOUNTER — Telehealth: Payer: No Typology Code available for payment source | Admitting: Nurse Practitioner

## 2020-04-08 ENCOUNTER — Other Ambulatory Visit: Payer: Self-pay | Admitting: Obstetrics and Gynecology

## 2020-04-08 DIAGNOSIS — R102 Pelvic and perineal pain: Secondary | ICD-10-CM

## 2020-05-08 ENCOUNTER — Other Ambulatory Visit: Payer: Self-pay | Admitting: Obstetrics & Gynecology

## 2020-05-08 DIAGNOSIS — R102 Pelvic and perineal pain: Secondary | ICD-10-CM

## 2020-05-10 ENCOUNTER — Telehealth: Payer: Self-pay | Admitting: General Practice

## 2020-05-10 DIAGNOSIS — R102 Pelvic and perineal pain: Secondary | ICD-10-CM

## 2020-05-10 MED ORDER — DESOGESTREL-ETHINYL ESTRADIOL 0.15-30 MG-MCG PO TABS: ORAL_TABLET | ORAL | 2 refills | Status: DC

## 2020-05-10 NOTE — Telephone Encounter (Signed)
A family member called and left message on nurse voicemail line on behalf of Deanna Robles stating that she needs a refill on her two medications but they didn't state which medication she needed refilled.   Returned call to patient and patient states she needs the birth control pills refilled for her endometriosis. Refilled provided per chart review. Advised to follow up with [name] in 3 months and to call by July if she hasn't heard from [name]. Patient verbalized understanding.

## 2020-08-19 ENCOUNTER — Other Ambulatory Visit: Payer: Self-pay

## 2020-08-19 ENCOUNTER — Encounter: Payer: Self-pay | Admitting: Obstetrics and Gynecology

## 2020-08-19 ENCOUNTER — Ambulatory Visit (INDEPENDENT_AMBULATORY_CARE_PROVIDER_SITE_OTHER): Payer: Self-pay | Admitting: Obstetrics and Gynecology

## 2020-08-19 VITALS — BP 115/81 | HR 82 | Ht 62.0 in | Wt 185.6 lb

## 2020-08-19 DIAGNOSIS — R102 Pelvic and perineal pain: Secondary | ICD-10-CM

## 2020-08-19 DIAGNOSIS — Z01419 Encounter for gynecological examination (general) (routine) without abnormal findings: Secondary | ICD-10-CM

## 2020-08-19 DIAGNOSIS — N946 Dysmenorrhea, unspecified: Secondary | ICD-10-CM

## 2020-08-19 DIAGNOSIS — Z1239 Encounter for other screening for malignant neoplasm of breast: Secondary | ICD-10-CM

## 2020-08-19 DIAGNOSIS — D259 Leiomyoma of uterus, unspecified: Secondary | ICD-10-CM

## 2020-08-19 MED ORDER — DESOGESTREL-ETHINYL ESTRADIOL 0.15-30 MG-MCG PO TABS: 1.0000 | ORAL_TABLET | Freq: Every day | ORAL | 11 refills | Status: DC

## 2020-08-19 NOTE — Progress Notes (Signed)
Deanna Robles is a 40 y.o. [phone] female here for a routine annual gynecologic exam.  Current complaints: Needs refill of OCP's. Takes OCP's for cycle control and dysmenorrhea.    Gynecologic History Patient's last menstrual period was 08/02/2020 (approximate). Contraception: OCP (estrogen/progesterone) Last Pap: 2020. Results were: normal Last mammogram: NA. Results were: NA  Obstetric History OB History  Gravida Para Term Preterm AB Living  '3       1 2  '$ SAB IAB Ectopic Multiple Live Births  '1     1 2    '$ # Outcome Date GA Lbr Len/2nd Weight Sex Delivery Anes PTL Lv  3 Gravida           2 Gravida           1 SAB             Past Medical History:  Diagnosis Date   Depression    Fibroids    Hyperlipidemia     Past Surgical History:  Procedure Laterality Date   Tooth Molar Extraction      Current Outpatient Medications on File Prior to Visit  Medication Sig Dispense Refill   atorvastatin (LIPITOR) 10 MG tablet Take 1 tablet (10 mg total) by mouth daily. 90 tablet 3   Vitamin D, Ergocalciferol, (DRISDOL) 1.25 MG (50000 UNIT) CAPS capsule Take 1 capsule (50,000 Units total) by mouth every 7 (seven) days. 12 capsule 1   No current facility-administered medications on file prior to visit.    No Known Allergies  Social History   Socioeconomic History   Marital status: Married    Spouse name: Not on file   Number of children: Not on file   Years of education: Not on file   Highest education level: 9th grade  Occupational History   Not on file  Tobacco Use   Smoking status: Never   Smokeless tobacco: Never  Vaping Use   Vaping Use: Never used  Substance and Sexual Activity   Alcohol use: No    Comment: occasionally   Drug use: No   Sexual activity: Yes    Birth control/protection: Pill  Other Topics Concern   Not on file  Social History Narrative   Not on file   Social Determinants of Health   Financial Resource Strain: Not on file  Food  Insecurity: Not on file  Transportation Needs: Not on file  Physical Activity: Not on file  Stress: Not on file  Social Connections: Not on file  Intimate Partner Violence: Not on file    Family History  Problem Relation Age of Onset   Hypertension Mother    Diabetes Mother    Hyperlipidemia Mother    Diabetes Brother    Hyperlipidemia Brother    Fibroids Sister     The following portions of the patient's history were reviewed and updated as appropriate: allergies, current medications, past family history, past medical history, past social history, past surgical history and problem list.  Review of Systems Pertinent items noted in HPI and remainder of comprehensive ROS otherwise negative.   Objective:  BP 115/81   Pulse 82   Ht '5\' 2"'$  (1.575 m)   Wt 185 lb 9.6 oz (84.2 kg)   LMP 08/02/2020 (Approximate)   BMI 33.95 kg/m  CONSTITUTIONAL: Well-developed, well-nourished female in no acute distress.  HENT:  Normocephalic, atraumatic, External right and left ear normal. Oropharynx is clear and moist EYES: Conjunctivae and EOM are normal. Pupils are equal, round, and reactive to  light. No scleral icterus.  NECK: Normal range of motion, supple, no masses.  Normal thyroid.  SKIN: Skin is warm and dry. No rash noted. Not diaphoretic. No erythema. No pallor. [place]: Alert and oriented to person, place, and time. Normal reflexes, muscle tone coordination. No cranial nerve deficit noted. PSYCHIATRIC: Normal mood and affect. Normal behavior. Normal judgment and thought content. CARDIOVASCULAR: Normal heart rate noted, regular rhythm RESPIRATORY: Clear to auscultation bilaterally. Effort and breath sounds normal, no problems with respiration noted. BREASTS: Deferred ABDOMEN: Soft, normal bowel sounds, no distention noted.  No tenderness, rebound or guarding.  PELVIC: Deferred MUSCULOSKELETAL: Normal range of motion. No tenderness.  No cyanosis, clubbing, or edema.  2+ distal  pulses.   Assessment:  Annual gynecologic examination with pap smear Dysmenorrhea   Plan:  OCP's refilled Pap smear UTP Referred to [facility] for mammogram d/t finical situation. Routine preventative health maintenance measures emphasized. Please refer to After Visit Summary for other counseling recommendations.    Chancy Milroy, MD, [place] Attending [place] for [facility] [place]

## 2020-08-19 NOTE — Patient Instructions (Signed)
Mantenimiento de Technical sales engineer en [place] [employer], Female Adoptar un estilo de vida saludable y recibir atencin preventiva son importantes para promover la salud y Musician. Consulte al mdico sobre: El esquema adecuado para hacerse pruebas y exmenes peridicos. Cosas que puede hacer por su cuenta para prevenir enfermedades y [employer]. Qu debo saber sobre la dieta, el peso y el ejercicio? Consuma una dieta saludable  Consuma una dieta que incluya muchas verduras, frutas, productos lcteos con bajo contenido de [place] y Advertising account planner. No consuma muchos alimentos ricos en grasas slidas, azcares agregados o sodio.  Mantenga un peso saludable El ndice de masa muscular [facility]) se [place] the [place] para identificar problemas de [place]. Proporciona una estimacin de la grasa corporal basndose en el peso y la altura. Su mdico puede ayudarle a Radiation protection practitioner [place] y a Scientist, forensic o Theatre manager unpeso saludable. Haga ejercicio con regularidad Haga ejercicio con regularidad. Esta es una de las prcticas ms importantes que puede hacer por su salud. La [employer] de los adultos deben seguir estas pautas: Optometrist, al menos, 150 minutos de actividad fsica por semana. El ejercicio debe aumentar la frecuencia cardaca y Nature conservation officer transpirar (ejercicio de intensidad moderada). Hacer ejercicios de fortalecimiento por lo [employer] por semana. Agregue esto a su plan de ejercicio de intensidad moderada. Pasar menos tiempo sentados. Incluso la actividad fsica ligera puede ser beneficiosa. Controle sus niveles de colesterol y lpidos en la sangre Comience a realizarse anlisis de lpidos y colesterol en la sangre a los20 aos y luego reptalos cada 5 aos. Hgase controlar los niveles de colesterol con mayor frecuencia si: Sus niveles de lpidos y colesterol son altos. Es mayor de 42 aos. Presenta un alto riesgo de padecer enfermedades cardacas. Qu debo saber sobre las pruebas de deteccin del  cncer? Segn su historia clnica y sus antecedentes familiares, es posible que deba realizarse pruebas de deteccin del cncer en diferentes edades. Esto puede incluir pruebas de deteccin de lo siguiente: Cncer de mama. Cncer de cuello uterino. Cncer colorrectal. Cncer de piel. Cncer de pulmn. Qu debo saber sobre la enfermedad cardaca, la diabetes y la hipertensinarterial? Presin arterial y enfermedad cardaca La hipertensin arterial causa enfermedades cardacas y [place] el riesgo de accidente cerebrovascular. Es ms probable que esto se manifieste en las personas que tienen lecturas de presin arterial alta, tienen ascendencia africana o tienen sobrepeso. Hgase controlar la presin arterial: Cada 3 a 5 aos si tiene entre 18 y 11 aos. Todos los aos si es mayor de 40 aos. Diabetes Realcese exmenes de deteccin de la diabetes con regularidad. Este anlisis revisa el nivel de azcar en la sangre en [place]. Hgase las pruebas de deteccin: Cada tres aos despus de los 20 aos de edad si tiene un peso normal y un bajo riesgo de padecer diabetes. Con ms frecuencia y a partir de [place] edad inferior si tiene sobrepeso o un alto riesgo de padecer diabetes. Qu debo saber sobre la prevencin de infecciones? Hepatitis B Si tiene un riesgo ms alto de contraer hepatitis B, debe someterse a un examen de deteccin de este virus. Hable con el mdico para averiguar si tiene riesgode contraer la infeccin por hepatitis B. Hepatitis C Se recomienda el anlisis a: [employer] 1945 y 1965. Todas las personas que tengan un riesgo de haber contrado hepatitis C. Enfermedades de transmisin sexual (ETS) Hgase las pruebas de Programme researcher, broadcasting/film/video de ITS, incluidas la gonorrea y la clamidia, si: Es sexualmente activa y es Garment/textile technologist de 24  aos. Es mayor de 52 aos, y el mdico le informa que corre riesgo de tener este tipo de infecciones. La actividad sexual ha cambiado desde que le  hicieron la ltima prueba de deteccin y tiene un riesgo mayor de Best boy clamidia o Radio broadcast assistant. Pregntele al mdico si usted tiene riesgo. Pregntele al mdico si usted tiene un alto riesgo de Museum/gallery curator VIH. El mdico tambin puede recomendarle un medicamento recetado para ayudar a evitar la infeccin por el VIH. Si elige tomar medicamentos para prevenir el VIH, primero debe [employer] de deteccin del VIH. Luego debe hacerse anlisis cada 3 meses mientras est tomando los medicamentos. Embarazo Si est por dejar de Librarian, academic (fase premenopusica) y usted puede quedar [place], busque asesoramiento antes de [place]. Tome de 400 a 800 microgramos (mcg) de cido [employer] si [place]. Pida mtodos de control de la natalidad (anticonceptivos) si desea evitar un embarazo no deseado. Osteoporosis y [place] La osteoporosis es una enfermedad en la que los huesos pierden los minerales y la fuerza por el avance de la edad. El resultado pueden ser fracturas en los [place]. Si tiene 28 aos o ms, o si est en riesgo de sufrir osteoporosis y fracturas, pregunte a su mdico si debe: Hacerse pruebas de deteccin de prdida sea. Tomar un suplemento de calcio o de vitamina D para reducir el riesgo de fracturas. Recibir terapia de reemplazo hormonal (TRH) para tratar los sntomas de la menopausia. Siga estas instrucciones en su casa: Estilo de vida No consuma ningn producto que contenga nicotina o tabaco, como cigarrillos, cigarrillos electrnicos y tabaco de Higher education careers adviser. Si necesita ayuda para dejar de fumar, consulte al mdico. No consuma drogas. No comparta agujas. Solicite ayuda a su mdico si necesita apoyo o informacin para abandonar las drogas. Consumo de alcohol No beba alcohol si: Su mdico le indica no hacerlo. Est embarazada, puede estar embarazada o est tratando de [place]. Si bebe alcohol: Limite la cantidad que consume de 0 a 1 medida por  da. Limite la ingesta si est amamantando. Est atento a la cantidad de alcohol que hay en las bebidas que toma. En los [place], una medida equivale a una botella de cerveza de 12 oz (355 ml), un vaso de vino de 5 oz (148 ml) o un vaso de una bebida alcohlica de alta graduacin de 1 oz (44 ml). Instrucciones generales Realcese los estudios de rutina de la salud, dentales y de Public librarian. [place]. Infrmele a su mdico si: Se siente deprimida con frecuencia. Alguna vez ha sido vctima de [place] o no se siente segura en su casa. Resumen Adoptar un estilo de vida saludable y recibir atencin preventiva son importantes para promover la salud y Musician. Siga las instrucciones del mdico acerca de una dieta saludable, el ejercicio y la realizacin de pruebas o exmenes para Engineer, building services. Siga las instrucciones del mdico con respecto al control del colesterol y la presin arterial. Esta informacin no tiene Marine scientist el consejo del mdico. Asegresede hacerle al mdico cualquier pregunta que tenga. Document Revised: 01/16/2018 Document Reviewed: 01/16/2018 Elsevier Patient Education  [place].

## 2021-06-01 IMAGING — MG DIGITAL DIAGNOSTIC BILAT W/ TOMO W/ CAD
6 of 10 series · 6 of 30 positions shown · non-contrast
Comparison: None.

CLINICAL DATA: 38-year-old female presenting for baseline mammogram
to evaluate spontaneous white left nipple discharge.She says the
discharge occurs couple of times a month, and has the appearance of
colostrum.

EXAM:
DIGITAL DIAGNOSTIC BILATERAL MAMMOGRAM WITH CAD AND TOMO
ULTRASOUND LEFT BREAST

[R CC synth-2D]
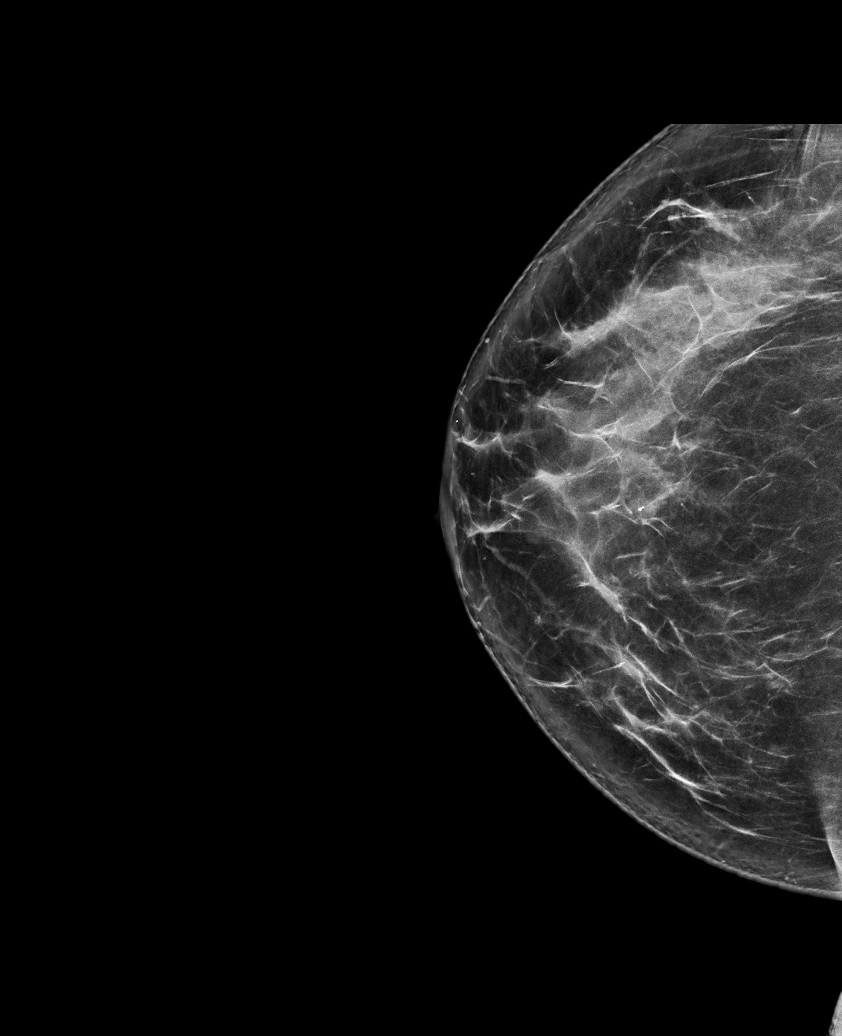

[L TAN synth-2D]
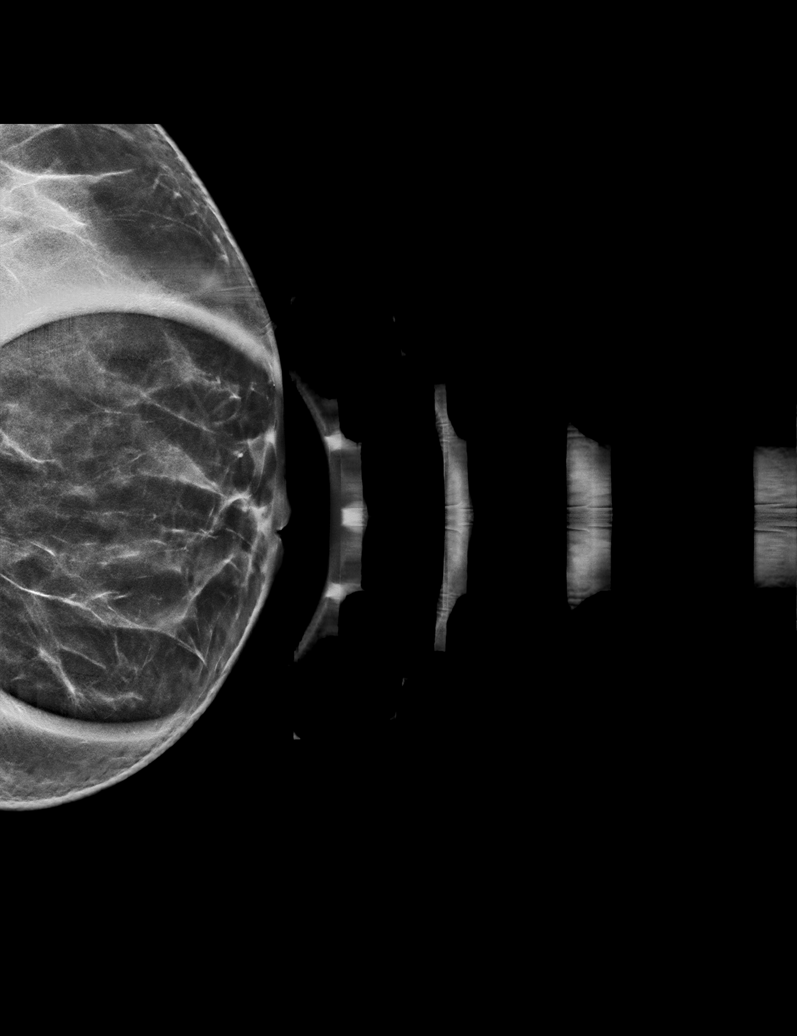

[L MLO synth-2D]
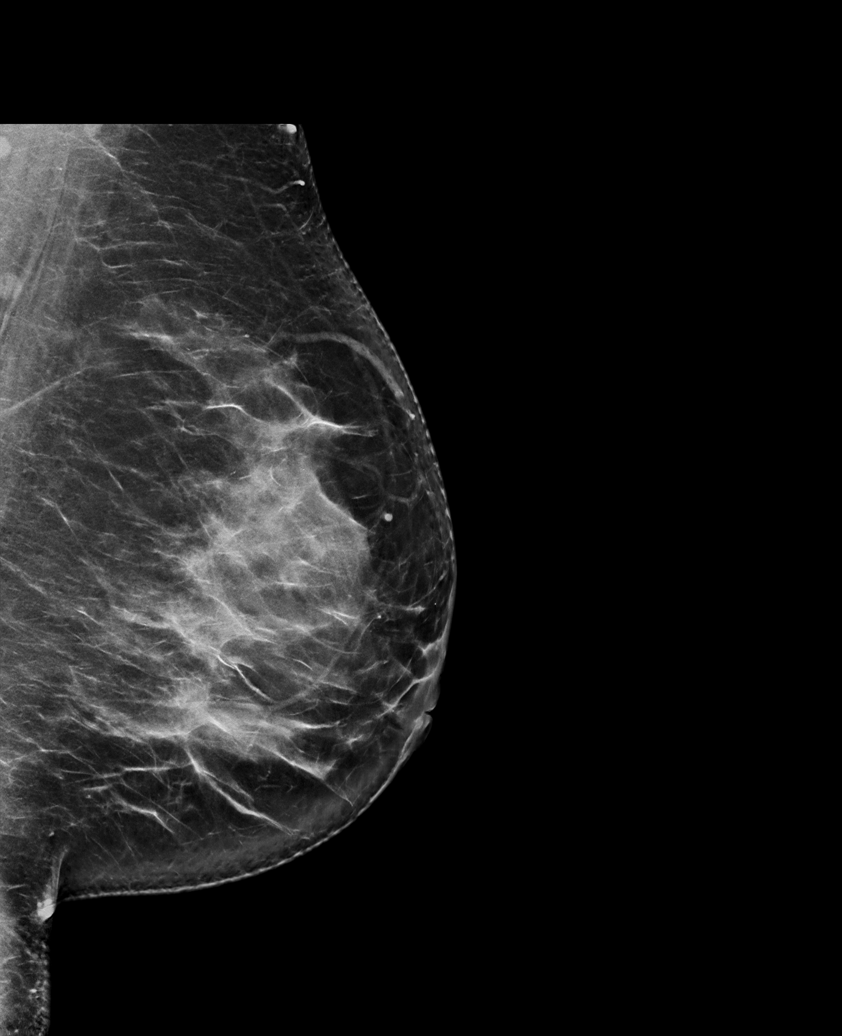

[L CC synth-2D]
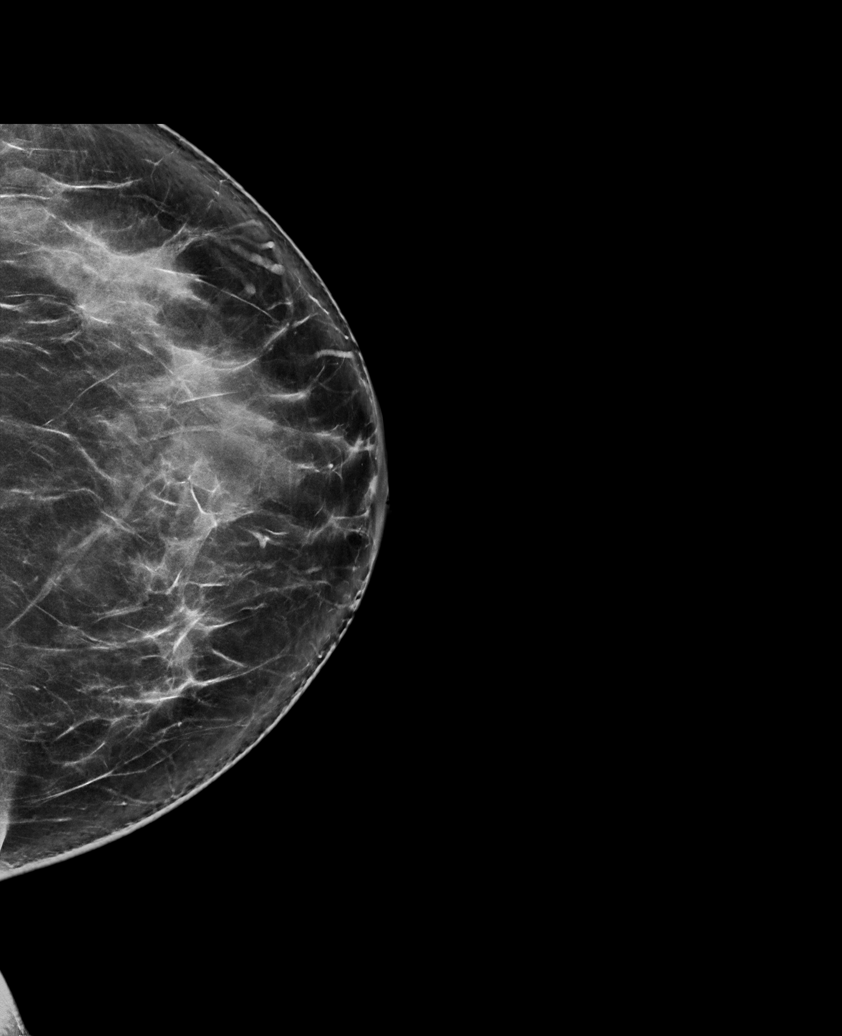

[R MLO synth-2D]
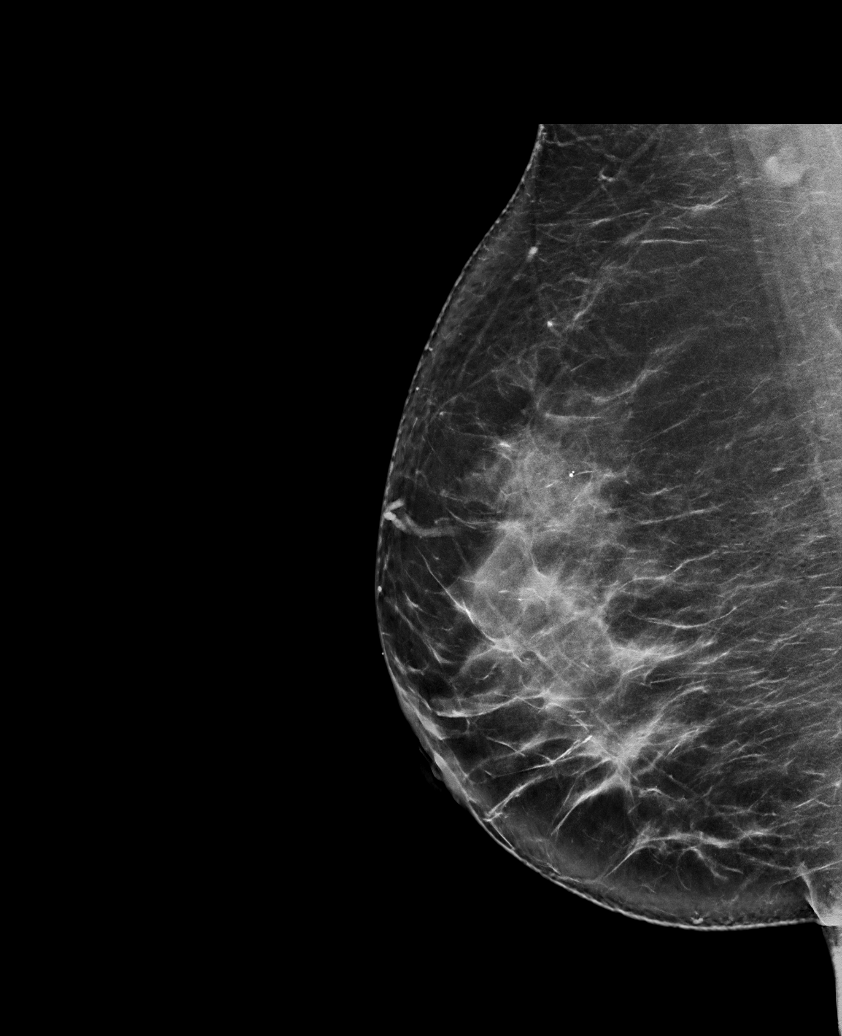

[L MLO tomo · tomo slice 47/92.0]
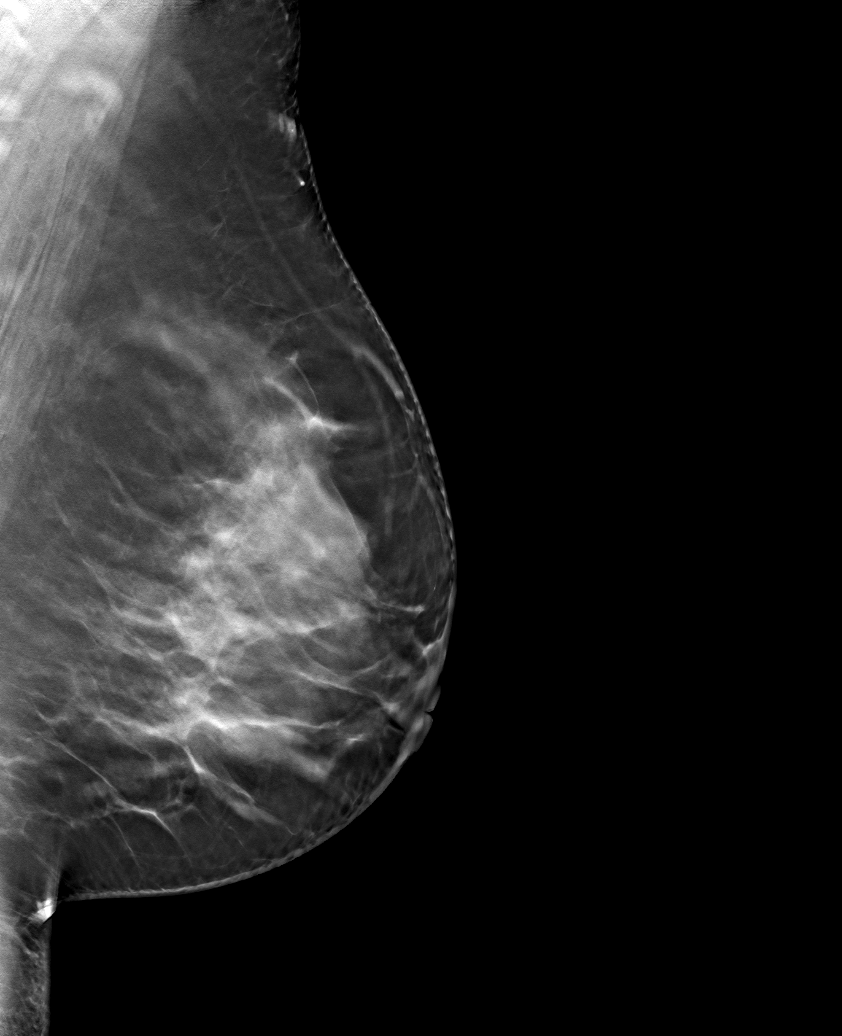

[6 of 30 positions shown; findings below may reference images not displayed]

ACR Breast Density Category c: The breast tissue is heterogeneously
dense, which may obscure small masses.
FINDINGS: No suspicious calcifications, masses or areas of distortion are seen
in the bilateral breasts.

Mammographic images were processed with CAD.

On physical exam, no discharge could be elicited from the left
nipple with manual compression. No retroareolar masses are palpated.

Targeted ultrasound is performed, showing normal fibroglandular
tissue in the retroareolar left breast. No dilated ducts,
intraductal or parenchymal masses are seen.
IMPRESSION: 1. No mammographic or targeted sonographic abnormalities in the
retroareolar left breast to correspond with the patient's nipple
discharge.

2.  No mammographic evidence of malignancy in the bilateral breasts.

RECOMMENDATION:
1. Further management of nipple discharge should be based on
clinical assessment. This is felt to be physiologic/related to a
benign process given that it is milky in appearance. The patient was
advised to alert her doctor if she experiences spontaneous
unilateral bloody or clear nipple discharge from a single duct
only.

2.  Screening mammogram in one year.(Code:AP-N-9EE)

I have discussed the findings and recommendations with the patient.
If applicable, a reminder letter will be sent to the patient
regarding the next appointment.

BI-RADS CATEGORY  1: Negative.

## 2021-08-17 ENCOUNTER — Telehealth: Payer: Self-pay | Admitting: Family Medicine

## 2021-08-17 DIAGNOSIS — Z3041 Encounter for surveillance of contraceptive pills: Secondary | ICD-10-CM

## 2021-08-17 NOTE — Telephone Encounter (Signed)
Called patient with [place] assisting with spanish interpretation, no answer on any number. Left message on husband's voicemail to call us back.

## 2021-08-17 NOTE — Telephone Encounter (Signed)
Patient called and left message stating she is returning our call.

## 2021-08-17 NOTE — Telephone Encounter (Signed)
Refill on Birth Control

## 2021-08-18 MED ORDER — DESOGESTREL-ETHINYL ESTRADIOL 0.15-30 MG-MCG PO TABS: 1.0000 | ORAL_TABLET | Freq: Every day | ORAL | 2 refills | Status: DC

## 2021-08-18 NOTE — Telephone Encounter (Signed)
Called patient with Deanna Robles assisting with spanish interpretation. Informed her of refills sent to pharmacy and need for annual exam. Advised patient someone will call her with an appt. Patient verbalized understanding.

## 2021-10-07 ENCOUNTER — Ambulatory Visit
Admission: EM | Admit: 2021-10-07 | Discharge: 2021-10-07 | Discharge status: Absent without leave | Payer: No Typology Code available for payment source

## 2021-10-08 ENCOUNTER — Ambulatory Visit
Admission: EM | Admit: 2021-10-08 | Discharge: 2021-10-08 | Disposition: A | Discharge status: Home / Self Care | Payer: No Typology Code available for payment source | Attending: Physician Assistant | Admitting: Physician Assistant

## 2021-10-08 VITALS — BP 136/56 | HR 96 | Temp 98.5°F | Resp 18

## 2021-10-08 DIAGNOSIS — J209 Acute bronchitis, unspecified: Secondary | ICD-10-CM

## 2021-10-08 MED ORDER — PREDNISONE 20 MG PO TABS: 40.0000 mg | ORAL_TABLET | Freq: Every day | ORAL | 0 refills | Status: AC

## 2021-10-08 MED ORDER — AZITHROMYCIN 250 MG PO TABS: 250.0000 mg | ORAL_TABLET | Freq: Every day | ORAL | 0 refills | Status: DC

## 2021-10-08 NOTE — ED Provider Notes (Signed)
[facility]    CSN: [id number] Arrival date & time: 10/08/21  0859      History   Chief Complaint Chief Complaint  Patient presents with   Cough    HPI Deanna Robles is a 41 y.o. female.   Patient here today with daughter who helps with translation at patient's request.  She reports that she has had a nonproductive cough and chest congestion for a month.  She denies any fever.  She has not had any significant nasal congestion, sore throat, or ear pain.  She denies any nausea, vomiting or diarrhea.  She has tried multiple over-the-counter medications without significant relief.  She does not have history of asthma or other lung disorder.  The history is provided by the patient.  Cough Associated symptoms: no chills, no ear pain, no eye discharge, no fever, no shortness of breath, no sore throat and no wheezing     Past Medical History:  Diagnosis Date   Depression    Fibroids    Hyperlipidemia     Patient Active Problem List   Diagnosis Date Noted   Visit for routine gyn exam 08/19/2020   Dysmenorrhea 05/01/2019   Pelvic pain 03/14/2019   Uterine fibroid 03/14/2019   Depression 11/25/2013   Routine general medical examination at a health care facility 10/11/2012   GERD (gastroesophageal reflux disease) 10/11/2012   Dental caries 08/08/2012   Chronic dental pain 08/08/2012    Past Surgical History:  Procedure Laterality Date   Tooth Molar Extraction      OB History     Gravida  3   Para      Term      Preterm      AB  1   Living  2      SAB  1   IAB      Ectopic      Multiple  1   Live Births  2            Home Medications    Prior to Admission medications   Medication Sig Start Date End Date Taking? Authorizing Provider  azithromycin (ZITHROMAX) 250 MG tablet Take 1 tablet (250 mg total) by mouth daily. Take first 2 tablets together, then 1 every day until finished. 10/08/21  Yes Francene Finders, PA-C   predniSONE (DELTASONE) 20 MG tablet Take 2 tablets (40 mg total) by mouth daily with breakfast for 5 days. 10/08/21 10/13/21 Yes Francene Finders, PA-C  atorvastatin (LIPITOR) 10 MG tablet Take 1 tablet (10 mg total) by mouth daily. 02/11/19   Gildardo Pounds, NP  desogestrel-ethinyl estradiol (APRI) 0.15-30 MG-MCG tablet Take 1 tablet by mouth daily. 08/18/21   Donnamae Jude, MD  Vitamin D, Ergocalciferol, (DRISDOL) 1.25 MG (50000 UNIT) CAPS capsule Take 1 capsule (50,000 Units total) by mouth every 7 (seven) days. 02/10/20   Gildardo Pounds, NP    Family History Family History  Problem Relation Age of Onset   Hypertension Mother    Diabetes Mother    Hyperlipidemia Mother    Diabetes Brother    Hyperlipidemia Brother    Fibroids Sister     Social History Social History   Tobacco Use   Smoking status: Never   Smokeless tobacco: Never  Vaping Use   Vaping Use: Never used  Substance Use Topics   Alcohol use: No    Comment: occasionally   Drug use: No     Allergies   Patient has  no known allergies.   Review of Systems Review of Systems  Constitutional:  Negative for chills and fever.  HENT:  Positive for congestion. Negative for ear pain and sore throat.   Eyes:  Negative for discharge and redness.  Respiratory:  Positive for cough. Negative for shortness of breath and wheezing.   Gastrointestinal:  Negative for abdominal pain, diarrhea, nausea and vomiting.     Physical Exam Triage Vital Signs ED Triage Vitals  Enc Vitals Group     BP 10/08/21 0911 (!) 136/56     Pulse Rate 10/08/21 0911 96     Resp 10/08/21 0911 18     Temp 10/08/21 0911 98.5 F (36.9 C)     Temp Source 10/08/21 0911 Oral     SpO2 10/08/21 0911 97 %     Weight --      Height --      Head Circumference --      Peak Flow --      Pain Score 10/08/21 0909 3     Pain Loc --      Pain Edu? --      Excl. in [place]? --    No data found.  Updated Vital Signs BP (!) 136/56 (BP Location: Left Arm)    Pulse 96   Temp 98.5 F (36.9 C) (Oral)   Resp 18   SpO2 97%      Physical Exam Vitals and nursing note reviewed.  Constitutional:      General: She is not in acute distress.    Appearance: Normal appearance. She is not ill-appearing.  HENT:     Head: Normocephalic and atraumatic.     Nose: Congestion present.     Mouth/Throat:     Mouth: Mucous membranes are moist.     Pharynx: No oropharyngeal exudate or posterior oropharyngeal erythema.  Eyes:     Conjunctiva/sclera: Conjunctivae normal.  Cardiovascular:     Rate and Rhythm: Normal rate and regular rhythm.     Heart sounds: Normal heart sounds. No murmur heard. Pulmonary:     Effort: Pulmonary effort is normal. No respiratory distress.     Breath sounds: Normal breath sounds. No wheezing, rhonchi or rales.  Skin:    General: Skin is warm and dry.  Neurological:     Mental Status: She is alert.  Psychiatric:        Mood and Affect: Mood normal.        Thought Content: Thought content normal.      UC Treatments / Results  Labs (all labs ordered are listed, but only abnormal results are displayed) Labs Reviewed - No data to display  EKG   Radiology No results found.  Procedures Procedures (including critical care time)  Medications Ordered in UC Medications - No data to display  Initial Impression / Assessment and Plan / UC Course  I have reviewed the triage vital signs and the nursing notes.  Pertinent labs & imaging results that were available during my care of the patient were reviewed by me and considered in my medical decision making (see chart for details).    Given duration of symptoms suspect likely bronchitis.  Will treat with steroid burst as well as Z-Pak to cover possible atypical pneumonia.  Encouraged follow-up if symptoms do not improve or with any further concerns.  Final Clinical Impressions(s) / UC Diagnoses   Final diagnoses:  Acute bronchitis, unspecified organism   Discharge  Instructions   None    ED  Prescriptions     Medication Sig Dispense Auth. Provider   predniSONE (DELTASONE) 20 MG tablet Take 2 tablets (40 mg total) by mouth daily with breakfast for 5 days. 10 tablet Ewell Poe F, PA-C   azithromycin (ZITHROMAX) 250 MG tablet Take 1 tablet (250 mg total) by mouth daily. Take first 2 tablets together, then 1 every day until finished. 6 tablet Francene Finders, PA-C      PDMP not reviewed this encounter.   Francene Finders, PA-C 10/08/21 1020

## 2021-10-08 NOTE — ED Triage Notes (Signed)
Pt presents with non productive cough and chest congestion X 1 month with no relief.

## 2021-10-31 ENCOUNTER — Ambulatory Visit: Payer: No Typology Code available for payment source | Admitting: Family Medicine

## 2021-11-22 ENCOUNTER — Ambulatory Visit: Payer: Self-pay

## 2021-11-27 ENCOUNTER — Other Ambulatory Visit: Payer: Self-pay | Admitting: Family Medicine

## 2021-11-27 DIAGNOSIS — Z3041 Encounter for surveillance of contraceptive pills: Secondary | ICD-10-CM

## 2022-01-23 ENCOUNTER — Telehealth: Payer: Self-pay

## 2022-01-23 NOTE — Telephone Encounter (Signed)
Telephoned patient at mobile number. Language line interpreter [phone] used for this call. Voice mail not set up, unable to leave a message from [place].

## 2022-02-14 ENCOUNTER — Other Ambulatory Visit: Payer: Self-pay | Admitting: Obstetrics and Gynecology

## 2022-02-14 DIAGNOSIS — Z1231 Encounter for screening mammogram for malignant neoplasm of breast: Secondary | ICD-10-CM

## 2022-03-09 ENCOUNTER — Ambulatory Visit: Payer: Self-pay | Admitting: Hematology and Oncology

## 2022-03-09 ENCOUNTER — Ambulatory Visit
Admission: RE | Admit: 2022-03-09 | Discharge: 2022-03-09 | Disposition: A | Discharge status: Home / Self Care | Payer: No Typology Code available for payment source | Source: Ambulatory Visit | Attending: Obstetrics and Gynecology | Admitting: Obstetrics and Gynecology

## 2022-03-09 VITALS — BP 118/80 | Wt 197.0 lb

## 2022-03-09 DIAGNOSIS — Z1231 Encounter for screening mammogram for malignant neoplasm of breast: Secondary | ICD-10-CM

## 2022-03-09 NOTE — Patient Instructions (Signed)
Taught Deanna Robles about self breast awareness and gave educational materials to take home. Patient did not need a Pap smear today due to last Pap smear was in 2020 per patient. Let her know [facility] will cover Pap smears every 5 years unless has a history of abnormal Pap smears. Referred patient to the [facility] for screening mammogram. Appointment scheduled for 03/09/22. Patient aware of appointment and will be there. Let patient know will follow up with her within the next couple weeks with results. Deanna Robles verbalized understanding.  Melodye Ped, NP 12:34 PM

## 2022-03-09 NOTE — Progress Notes (Addendum)
Ms. Deanna Robles is a 42 y.o. female who presents to [facility] clinic today with no complaints.    Pap Smear: Pap not smear completed today. Last Pap smear was 2020 and was normal. Per patient has no history of an abnormal Pap smear. Last Pap smear result is available in Epic.   Physical exam: Breasts Breasts symmetrical. No skin abnormalities bilateral breasts. No nipple retraction bilateral breasts. No nipple discharge bilateral breasts. No lymphadenopathy. No lumps palpated bilateral breasts.   MS DIGITAL DIAG TOMO BILAT  Result Date: 03/25/2019 CLINICAL DATA:  42 year old female presenting for baseline mammogram to evaluate spontaneous white left nipple discharge.She says the discharge occurs couple of times a month, and has the appearance of colostrum. EXAM: DIGITAL DIAGNOSTIC BILATERAL MAMMOGRAM WITH CAD AND TOMO ULTRASOUND LEFT BREAST COMPARISON:  None. ACR Breast Density Category c: The breast tissue is heterogeneously dense, which may obscure small masses. FINDINGS: No suspicious calcifications, masses or areas of distortion are seen in the bilateral breasts. Mammographic images were processed with CAD. On physical exam, no discharge could be elicited from the left nipple with manual compression. No retroareolar masses are palpated. Targeted ultrasound is performed, showing normal fibroglandular tissue in the retroareolar left breast. No dilated ducts, intraductal or parenchymal masses are seen. IMPRESSION: 1. No mammographic or targeted sonographic abnormalities in the retroareolar left breast to correspond with the patient's nipple discharge. 2.  No mammographic evidence of malignancy in the bilateral breasts. RECOMMENDATION: 1. Further management of nipple discharge should be based on clinical assessment. This is felt to be physiologic/related to a benign process given that it is milky in appearance. The patient was advised to alert her doctor if she experiences spontaneous unilateral  bloody or clear nipple discharge from a single duct only. 2.  Screening mammogram in one year.(Code:SM-B-01Y) I have discussed the findings and recommendations with the patient. If applicable, a reminder letter will be sent to the patient regarding the next appointment. BI-RADS CATEGORY  1: Negative. Electronically Signed   By: Ammie Ferrier M.D.   On: 03/25/2019 10:19        Pelvic/Bimanual Pap is not indicated today    Smoking History: Patient has never smoked and was not referred to quit line.    Patient Navigation: Patient education provided. Access to services provided for patient through [place] interpreter provided. No transportation provided   Colorectal Cancer Screening: Per patient has never had colonoscopy completed No complaints today.    Breast and Cervical Cancer Risk Assessment: Patient does not have family history of breast cancer, known genetic mutations, or radiation treatment to the chest before age 57. Patient does not have history of cervical dysplasia, immunocompromised, or DES exposure in-utero.  Risk Scores as of 03/09/2022     Deanna Robles           5-year 0.43 %   Lifetime 6.58 %   Because the patient's race/ethnicity is unknown, the model is using data for white patients and might not be accurate.         Last calculated by Claretha Cooper, CMA on 03/09/2022 at 12:44 PM        A: [facility] exam without pap smear No complaints with benign exam.   P: Referred patient to the [facility] for a screening mammogram. Appointment scheduled 03/09/22.  Melodye Ped, NP 03/09/2022 12:55 PM

## 2022-08-02 ENCOUNTER — Encounter: Payer: No Typology Code available for payment source | Admitting: Family

## 2022-09-14 ENCOUNTER — Encounter: Payer: Self-pay | Admitting: Family

## 2022-09-14 ENCOUNTER — Ambulatory Visit (INDEPENDENT_AMBULATORY_CARE_PROVIDER_SITE_OTHER): Payer: Self-pay | Admitting: Family

## 2022-09-14 ENCOUNTER — Other Ambulatory Visit (HOSPITAL_COMMUNITY)
Admission: RE | Admit: 2022-09-14 | Discharge: 2022-09-14 | Disposition: A | Discharge status: Home / Self Care | Payer: Self-pay | Source: Ambulatory Visit | Attending: Family | Admitting: Family

## 2022-09-14 VITALS — BP 119/82 | HR 69 | Temp 98.2°F | Ht 63.75 in | Wt 204.2 lb

## 2022-09-14 DIAGNOSIS — Z599 Problem related to housing and economic circumstances, unspecified: Secondary | ICD-10-CM

## 2022-09-14 DIAGNOSIS — Z113 Encounter for screening for infections with a predominantly sexual mode of transmission: Secondary | ICD-10-CM

## 2022-09-14 DIAGNOSIS — Z91018 Allergy to other foods: Secondary | ICD-10-CM

## 2022-09-14 DIAGNOSIS — Z603 Acculturation difficulty: Secondary | ICD-10-CM

## 2022-09-14 DIAGNOSIS — N809 Endometriosis, unspecified: Secondary | ICD-10-CM

## 2022-09-14 DIAGNOSIS — R1310 Dysphagia, unspecified: Secondary | ICD-10-CM

## 2022-09-14 DIAGNOSIS — Z13228 Encounter for screening for other metabolic disorders: Secondary | ICD-10-CM

## 2022-09-14 DIAGNOSIS — Z3041 Encounter for surveillance of contraceptive pills: Secondary | ICD-10-CM

## 2022-09-14 DIAGNOSIS — N946 Dysmenorrhea, unspecified: Secondary | ICD-10-CM

## 2022-09-14 DIAGNOSIS — N926 Irregular menstruation, unspecified: Secondary | ICD-10-CM

## 2022-09-14 DIAGNOSIS — Z7689 Persons encountering health services in other specified circumstances: Secondary | ICD-10-CM

## 2022-09-14 DIAGNOSIS — Z0100 Encounter for examination of eyes and vision without abnormal findings: Secondary | ICD-10-CM

## 2022-09-14 LAB — POCT URINE PREGNANCY: Preg Test, Ur: NEGATIVE

## 2022-09-14 LAB — POCT URINALYSIS DIP (CLINITEK)
Bilirubin, UA: NEGATIVE
Blood, UA: NEGATIVE
Glucose, UA: NEGATIVE mg/dL
Ketones, POC UA: NEGATIVE mg/dL
Leukocytes, UA: NEGATIVE
Nitrite, UA: NEGATIVE
POC PROTEIN,UA: NEGATIVE
Spec Grav, UA: 1.02 (ref 1.010–1.025)
Urobilinogen, UA: 0.2 U/dL
pH, UA: 7 (ref 5.0–8.0)

## 2022-09-14 MED ORDER — EPINEPHRINE 0.3 MG/0.3ML IJ SOAJ: 0.3000 mg | INTRAMUSCULAR | 1 refills | Status: AC | PRN

## 2022-09-14 MED ORDER — NAPROXEN 500 MG PO TABS: 500.0000 mg | ORAL_TABLET | Freq: Two times a day (BID) | ORAL | 1 refills | Status: AC

## 2022-09-14 MED ORDER — APRI 0.15-30 MG-MCG PO TABS
1.0000 | ORAL_TABLET | Freq: Every day | ORAL | 2 refills | Status: DC
Start: 2022-09-14 — End: 2022-12-13

## 2022-09-14 MED ORDER — DIPHENHYDRAMINE HCL 25 MG PO CAPS: 25.0000 mg | ORAL_CAPSULE | Freq: Three times a day (TID) | ORAL | 1 refills | Status: AC | PRN

## 2022-09-14 NOTE — Patient Instructions (Signed)
Thank you for choosing Primary Care at [name] for your medical home!    Deanna Robles was seen by [name], NP today.   Deanna Robles's primary care provider is [name], NP.   For the best care possible,  you should try to see Ricky Stabs, NP whenever you come to office.   We look forward to seeing you again soon!  If you have any questions about your visit today,  please call us at [phone]  Or feel free to reach your provider via MyChart.   Keeping you healthy   Get these tests Blood pressure- Have your blood pressure checked once a year by your healthcare provider.  Normal blood pressure is 120/80. Weight- Have your body mass index (BMI) calculated to screen for obesity.  BMI is a measure of body fat based on height and weight. You can also calculate your own BMI at https://www.west-esparza.com/. Cholesterol- Have your cholesterol checked regularly starting at age 99, sooner may be necessary if you have diabetes, high blood pressure, if a family member developed heart diseases at an early age or if you smoke.  Chlamydia, HIV, and other sexual transmitted disease- Get screened each year until the age of 48 then within three months of each new sexual partner. Diabetes- Have your blood sugar checked regularly if you have high blood pressure, high cholesterol, a family history of diabetes or if you are overweight.   Get these vaccines Flu shot- Every fall. Tetanus shot- Every 10 years. Menactra- Single dose; prevents meningitis.   Take these steps Don't smoke- If you do smoke, ask your healthcare provider about quitting. For tips on how to quit, go to www.smokefree.gov or call 1-800-QUIT-NOW. Be physically active- Exercise 5 days a week for at least 30 minutes.  If you are not already physically active start slow and gradually work up to 30 minutes of moderate physical activity.  Examples of moderate activity include walking briskly,  mowing the yard, dancing, swimming bicycling, etc. Eat a healthy diet- Eat a variety of healthy foods such as fruits, vegetables, low fat milk, low fat cheese, yogurt, lean meats, poultry, fish, beans, tofu, etc.  For more information on healthy eating, go to www.thenutritionsource.org Drink alcohol in moderation- Limit alcohol intake two drinks or less a day.  Never drink and drive. Dentist- Brush and floss teeth twice daily; visit your dentis twice a year. Depression-Your emotional health is as important as your physical health.  If you're feeling down, losing interest in things you normally enjoy please talk with your healthcare provider. Gun Safety- If you keep a gun in your home, keep it unloaded and with the safety lock on.  Bullets should be stored separately. Helmet use- Always wear a helmet when riding a motorcycle, bicycle, rollerblading or skateboarding. Safe sex- If you may be exposed to a sexually transmitted infection, use a condom Seat belts- Seat bels can save your life; always wear one. Smoke/Carbon Monoxide detectors- These detectors need to be installed on the appropriate level of your home.  Replace batteries at least once a year. Skin Cancer- When out in the sun, cover up and use sunscreen SPF 15 or higher. Violence- If anyone is threatening or hurting you, please tell your healthcare provider.

## 2022-09-14 NOTE — Progress Notes (Signed)
Subjective:    Deanna Robles - 42 y.o. female MRN [id number]  Date of birth: 26-Jul-1980  HPI  Deanna Robles is to establish care. She is accompanied by her daughter.  Current issues and/or concerns: - Reports endometriosis and irregular periods. Periods causing pain and would like to try medication to see if it helps. Reports in the past she was established with Gynecology and needs referral back. - Needs refills on birth control pills.  - Bilateral eyes blurry vision and watery. Reports in the past she was told she has polyps of right eye that would eventually need surgery.  - Requests kidneys be checked due to family history of father had kidney issues.  - Reports allergy to all fruit.  - Reports difficulty swallowing certain foods. She denies associated red flag symptoms.  - Reports she does not have health insurance. - No further issues/concerns for discussion today.   ROS per HPI     Health Maintenance:  Health Maintenance Due  Topic Date Due   Hepatitis C Screening  Never done   PAP SMEAR-Modifier  12/30/2021   INFLUENZA VACCINE  08/10/2022   COVID-19 Vaccine (1 - 2023-24 season) Never done     Past Medical History: Patient Active Problem List   Diagnosis Date Noted   Visit for routine gyn exam 08/19/2020   Dysmenorrhea 05/01/2019   Pelvic pain 03/14/2019   Uterine fibroid 03/14/2019   Depression 11/25/2013   Routine general medical examination at a health care facility 10/11/2012   GERD (gastroesophageal reflux disease) 10/11/2012   Dental caries 08/08/2012   Chronic dental pain 08/08/2012      Social History   reports that she has never smoked. She has never used smokeless tobacco. She reports that she does not drink alcohol and does not use drugs.   Family History  family history includes Diabetes in her brother and mother; Fibroids in her sister; Hyperlipidemia in her brother and mother; Hypertension in her mother.    Medications: reviewed and updated   Objective:   Physical Exam BP 119/82   Pulse 69   Temp 98.2 F (36.8 C) (Oral)   Ht 5' 3.75" (1.619 m)   Wt 204 lb 3.2 oz (92.6 kg)   LMP 09/05/2022 (Exact Date)   SpO2 95%   BMI 35.33 kg/m   Physical Exam HENT:     Head: Normocephalic and atraumatic.     Nose: Nose normal.     Mouth/Throat:     Mouth: Mucous membranes are moist.     Pharynx: Oropharynx is clear.  Eyes:     Extraocular Movements: Extraocular movements intact.     Conjunctiva/sclera: Conjunctivae normal.     Pupils: Pupils are equal, round, and reactive to light.  Cardiovascular:     Rate and Rhythm: Normal rate and regular rhythm.     Pulses: Normal pulses.     Heart sounds: Normal heart sounds.  Pulmonary:     Effort: Pulmonary effort is normal.     Breath sounds: Normal breath sounds.  Musculoskeletal:        General: Normal range of motion.     Cervical back: Normal range of motion and neck supple.  Neurological:     General: No focal deficit present.     Mental Status: She is alert and oriented to person, place, and time.  Psychiatric:        Mood and Affect: Mood normal.        Behavior: Behavior normal.  Assessment & Plan:  1. Encounter to establish care - Patient presents today to establish care. During the interim follow-up with primary provider as scheduled.  - Return for annual physical examination, labs, and health maintenance. Arrive fasting meaning having no food for at least 8 hours prior to appointment. You may have only water or black coffee. Please take scheduled medications as normal.  2. Endometriosis 3. Irregular menses 4. Dysmenorrhea - Naproxen as prescribed. Counseled on medication adherence/adverse effects. - Referral to Gynecology for further evaluation/management. During the interim follow-up with primary provider as scheduled until established with referral. - Ambulatory referral to Gynecology - naproxen (NAPROSYN) 500 MG  tablet; Take 1 tablet (500 mg total) by mouth 2 (two) times daily with a meal.  Dispense: 60 tablet; Refill: 1  5. Encounter for surveillance of contraceptive pills - Apri as prescribed. Counseled on medication adherence/adverse effects.  - Routine screening.  - Referral to Gynecology for further evaluation/management. During the interim follow-up with primary provider as scheduled until established with referral. - Ambulatory referral to Gynecology - POCT urine pregnancy; Future - desogestrel-ethinyl estradiol (APRI) 0.15-30 MG-MCG tablet; Take 1 tablet by mouth daily.  Dispense: 28 tablet; Refill: 2  6. Routine screening for STI (sexually transmitted infection) - Routine screening.  - POCT URINALYSIS DIP (CLINITEK); Future - Cervicovaginal ancillary only  7. Food allergy - Epinephrine injection as prescribed. Counseled on medication adherence.  - Diphenhydramine as prescribed. Counseled on medication adherence/adverse effects.  - Referral to Allergy for further evaluation/management. During the interim follow-up with primary provider as scheduled until established with referral. - EPINEPHrine 0.3 mg/0.3 mL IJ SOAJ injection; Inject 0.3 mg into the muscle as needed for anaphylaxis.  Dispense: 2 each; Refill: 1 - diphenhydrAMINE (BENADRYL ALLERGY) 25 mg capsule; Take 1 capsule (25 mg total) by mouth every 8 (eight) hours as needed.  Dispense: 30 capsule; Refill: 1 - Ambulatory referral to Allergy  8. Dysphagia, unspecified type - Referral to Gastroenterology for evaluation/management.  - Ambulatory referral to Gastroenterology  9. Routine eye exam - Referral to Ophthalmology for evaluation/management.  - Ambulatory referral to Ophthalmology  10. Screening for metabolic disorder - Routine screening.  - Basic Metabolic Panel  11. Financial difficulties - Offered patient [place] financial discount/orange card.   12. Language barrier -  in-person interpreter, Byrd Hesselbach.      Patient was given clear instructions to go to Emergency Department or return to medical center if symptoms don't improve, worsen, or new problems develop.The patient verbalized understanding.  I discussed the assessment and treatment plan with the patient. The patient was provided an opportunity to ask questions and all were answered. The patient agreed with the plan and demonstrated an understanding of the instructions.   The patient was advised to call back or seek an in-person evaluation if the symptoms worsen or if the condition fails to improve as anticipated.    Ricky Stabs, NP 09/14/2022, 9:54 AM Primary Care at [facility]

## 2022-09-14 NOTE — Progress Notes (Signed)
Pt states endometreosis and states pain is awful.   States right eye tears up, blurry vision, and itchy. Right eye only.   Pt states periods are happening twice a month.   States pain she has start in lower abdomen and travels to lower back.   Pt states her father passed away from kidney problems and wants some blood test for this.

## 2022-09-15 ENCOUNTER — Other Ambulatory Visit: Payer: Self-pay | Admitting: Family

## 2022-09-15 DIAGNOSIS — B9689 Other specified bacterial agents as the cause of diseases classified elsewhere: Secondary | ICD-10-CM | POA: Insufficient documentation

## 2022-09-15 LAB — CERVICOVAGINAL ANCILLARY ONLY
Bacterial Vaginitis (gardnerella): POSITIVE — AB
Candida Glabrata: NEGATIVE
Candida Vaginitis: NEGATIVE
Chlamydia: NEGATIVE
Comment: NEGATIVE
Comment: NEGATIVE
Comment: NEGATIVE
Comment: NEGATIVE
Comment: NEGATIVE
Comment: NORMAL
Neisseria Gonorrhea: NEGATIVE
Trichomonas: NEGATIVE

## 2022-09-15 LAB — BASIC METABOLIC PANEL
BUN/Creatinine Ratio: 30 — ABNORMAL HIGH (ref 9–23)
BUN: 19 mg/dL (ref 6–24)
CO2: 21 mmol/L (ref 20–29)
Calcium: 9.5 mg/dL (ref 8.7–10.2)
Chloride: 101 mmol/L (ref 96–106)
Creatinine, Ser: 0.64 mg/dL (ref 0.57–1.00)
Glucose: 111 mg/dL — ABNORMAL HIGH (ref 70–99)
Potassium: 4.9 mmol/L (ref 3.5–5.2)
Sodium: 139 mmol/L (ref 134–144)
eGFR: 113 mL/min/{1.73_m2} (ref >59)

## 2022-09-15 MED ORDER — METRONIDAZOLE 500 MG PO TABS
500.0000 mg | ORAL_TABLET | Freq: Two times a day (BID) | ORAL | 0 refills | Status: AC
Start: 2022-09-15 — End: 2022-09-22

## 2022-11-10 ENCOUNTER — Ambulatory Visit: Payer: Self-pay | Admitting: Allergy

## 2022-11-22 ENCOUNTER — Other Ambulatory Visit (HOSPITAL_COMMUNITY)
Admission: RE | Admit: 2022-11-22 | Discharge: 2022-11-22 | Disposition: A | Discharge status: Home / Self Care | Payer: Self-pay | Source: Ambulatory Visit | Attending: Family | Admitting: Family

## 2022-11-22 ENCOUNTER — Encounter: Payer: Self-pay | Admitting: Family

## 2022-11-22 ENCOUNTER — Ambulatory Visit: Payer: Self-pay | Admitting: Family

## 2022-11-22 ENCOUNTER — Telehealth: Payer: Self-pay | Admitting: Family

## 2022-11-22 VITALS — BP 120/70 | HR 68 | Temp 97.8°F | Ht 63.0 in | Wt 202.4 lb

## 2022-11-22 DIAGNOSIS — Z0000 Encounter for general adult medical examination without abnormal findings: Secondary | ICD-10-CM

## 2022-11-22 DIAGNOSIS — Z130 Encounter for screening for diseases of the blood and blood-forming organs and certain disorders involving the immune mechanism: Secondary | ICD-10-CM

## 2022-11-22 DIAGNOSIS — Z1329 Encounter for screening for other suspected endocrine disorder: Secondary | ICD-10-CM

## 2022-11-22 DIAGNOSIS — R109 Unspecified abdominal pain: Secondary | ICD-10-CM

## 2022-11-22 DIAGNOSIS — Z1231 Encounter for screening mammogram for malignant neoplasm of breast: Secondary | ICD-10-CM

## 2022-11-22 DIAGNOSIS — Z114 Encounter for screening for human immunodeficiency virus [HIV]: Secondary | ICD-10-CM

## 2022-11-22 DIAGNOSIS — Z13220 Encounter for screening for lipoid disorders: Secondary | ICD-10-CM

## 2022-11-22 DIAGNOSIS — Z603 Acculturation difficulty: Secondary | ICD-10-CM

## 2022-11-22 DIAGNOSIS — Z758 Other problems related to medical facilities and other health care: Secondary | ICD-10-CM

## 2022-11-22 DIAGNOSIS — Z1159 Encounter for screening for other viral diseases: Secondary | ICD-10-CM

## 2022-11-22 DIAGNOSIS — Z131 Encounter for screening for diabetes mellitus: Secondary | ICD-10-CM

## 2022-11-22 DIAGNOSIS — Z13228 Encounter for screening for other metabolic disorders: Secondary | ICD-10-CM

## 2022-11-22 LAB — POCT URINALYSIS DIP (CLINITEK)
Bilirubin, UA: NEGATIVE
Blood, UA: NEGATIVE
Glucose, UA: NEGATIVE mg/dL
Ketones, POC UA: NEGATIVE mg/dL
Leukocytes, UA: NEGATIVE
Nitrite, UA: NEGATIVE
POC PROTEIN,UA: NEGATIVE
Spec Grav, UA: >=1.03 — AB (ref 1.010–1.025)
Urobilinogen, UA: 0.2 U/dL
pH, UA: 6 (ref 5.0–8.0)

## 2022-11-22 LAB — POCT URINE PREGNANCY: Preg Test, Ur: NEGATIVE

## 2022-11-22 NOTE — Progress Notes (Signed)
Patient states back pain. Patient states bad headaches. States her family has kidney problems. Multiple complaints.   Patient wants Flu vaccine.

## 2022-11-22 NOTE — Telephone Encounter (Signed)
Pt is requesting [place] interpreter with call for lab results.

## 2022-11-22 NOTE — Progress Notes (Signed)
Patient ID: Deanna Robles, female    DOB: 10-24-1980  MRN: [id number]  CC: Annual Exam  Subjective: Deanna Robles is a 42 y.o. female who presents for annual exam. She is accompanied by her daughter.  Her concerns today include:  - She is not fasting.  - Intermittent abdominal pain radiating to back. Denies red flag symptoms.  - Up to date on pap smear.   Patient Active Problem List   Diagnosis Date Noted   BV (bacterial vaginosis) 09/15/2022   Visit for routine gyn exam 08/19/2020   Dysmenorrhea 05/01/2019   Pelvic pain 03/14/2019   Uterine fibroid 03/14/2019   Depression 11/25/2013   Routine general medical examination at a health care facility 10/11/2012   GERD (gastroesophageal reflux disease) 10/11/2012   Dental caries 08/08/2012   Chronic dental pain 08/08/2012     Current Outpatient Medications on File Prior to Visit  Medication Sig Dispense Refill   EPINEPHrine 0.3 mg/0.3 mL IJ SOAJ injection Inject 0.3 mg into the muscle as needed for anaphylaxis. 2 each 1   naproxen (NAPROSYN) 500 MG tablet Take 1 tablet (500 mg total) by mouth 2 (two) times daily with a meal. 60 tablet 1   aspirin 500 MG tablet Take 500 mg by mouth every 6 (six) hours as needed for pain. (Patient not taking: Reported on 11/22/2022)     atorvastatin (LIPITOR) 10 MG tablet Take 1 tablet (10 mg total) by mouth daily. (Patient not taking: Reported on 03/09/2022) 90 tablet 3   desogestrel-ethinyl estradiol (APRI) 0.15-30 MG-MCG tablet Take 1 tablet by mouth daily. (Patient not taking: Reported on 11/22/2022) 28 tablet 2   diphenhydrAMINE (BENADRYL ALLERGY) 25 mg capsule Take 1 capsule (25 mg total) by mouth every 8 (eight) hours as needed. (Patient not taking: Reported on 11/22/2022) 30 capsule 1   Vitamin D, Ergocalciferol, (DRISDOL) 1.25 MG (50000 UNIT) CAPS capsule Take 1 capsule (50,000 Units total) by mouth every 7 (seven) days. (Patient not taking: Reported on 03/09/2022) 12  capsule 1   No current facility-administered medications on file prior to visit.    Allergies  Allergen Reactions   Fruit Extracts Itching    States only watermelon and cucumber only fruits that she is okay to eat.     Social History   Socioeconomic History   Marital status: Married    Spouse name: Not on file   Number of children: 2   Years of education: Not on file   Highest education level: 9th grade  Occupational History   Not on file  Tobacco Use   Smoking status: Never   Smokeless tobacco: Never  Vaping Use   Vaping status: Never Used  Substance and Sexual Activity   Alcohol use: No    Comment: occasionally   Drug use: No   Sexual activity: Yes    Birth control/protection: Pill  Other Topics Concern   Not on file  Social History Narrative   Not on file   Social Determinants of Health   Financial Resource Strain: Not on file  Food Insecurity: No Food Insecurity (03/09/2022)   Hunger Vital Sign    Worried About Running Out of Food in the Last Year: Never true    Ran Out of Food in the Last Year: Never true  Transportation Needs: No Transportation Needs (03/09/2022)   PRAPARE - Administrator, Civil Service (Medical): No    Lack of Transportation (Non-Medical): No  Physical Activity: Not on file  Stress: Not on file  Social Connections: Not on file  Intimate Partner Violence: Not on file    Family History  Problem Relation Age of Onset   Hypertension Mother    Diabetes Mother    Hyperlipidemia Mother    Diabetes Brother    Hyperlipidemia Brother    Fibroids Sister     Past Surgical History:  Procedure Laterality Date   Tooth Molar Extraction      ROS: Review of Systems Negative except as stated above  PHYSICAL EXAM: BP 120/70   Pulse 68   Temp 97.8 F (36.6 C) (Oral)   Ht 5\' 3"  (1.6 m)   Wt 202 lb 6.4 oz (91.8 kg)   SpO2 93%   BMI 35.85 kg/m   Physical Exam HENT:     Head: Normocephalic and atraumatic.     Right Ear:  Tympanic membrane, ear canal and external ear normal.     Left Ear: Tympanic membrane, ear canal and external ear normal.     Nose: Nose normal.     Mouth/Throat:     Mouth: Mucous membranes are moist.     Pharynx: Oropharynx is clear.  Eyes:     Extraocular Movements: Extraocular movements intact.     Conjunctiva/sclera: Conjunctivae normal.     Pupils: Pupils are equal, round, and reactive to light.  Neck:     Thyroid: No thyroid mass, thyromegaly or thyroid tenderness.  Cardiovascular:     Rate and Rhythm: Normal rate and regular rhythm.     Pulses: Normal pulses.     Heart sounds: Normal heart sounds.  Pulmonary:     Effort: Pulmonary effort is normal.     Breath sounds: Normal breath sounds.  Chest:     Comments: Patient declined.  Abdominal:     General: Bowel sounds are normal.     Palpations: Abdomen is soft.  Genitourinary:    Comments: Patient declined.  Musculoskeletal:        General: Normal range of motion.     Right shoulder: Normal.     Left shoulder: Normal.     Right upper arm: Normal.     Left upper arm: Normal.     Right elbow: Normal.     Left elbow: Normal.     Right forearm: Normal.     Left forearm: Normal.     Right wrist: Normal.     Left wrist: Normal.     Right hand: Normal.     Left hand: Normal.     Cervical back: Normal, normal range of motion and neck supple.     Thoracic back: Normal.     Lumbar back: Normal.     Right hip: Normal.     Left hip: Normal.     Right upper leg: Normal.     Left upper leg: Normal.     Right knee: Normal.     Left knee: Normal.     Right lower leg: Normal.     Left lower leg: Normal.     Right ankle: Normal.     Left ankle: Normal.     Right foot: Normal.     Left foot: Normal.  Skin:    General: Skin is warm and dry.     Capillary Refill: Capillary refill takes less than 2 seconds.  Neurological:     General: No focal deficit present.     Mental Status: She is alert and oriented to person, place,  and time.  Psychiatric:        Mood and Affect: Mood normal.        Behavior: Behavior normal.      ASSESSMENT AND PLAN: 1. Annual physical exam - Counseled on 150 minutes of exercise per week as tolerated, healthy eating (including decreased daily intake of saturated fats, cholesterol, added sugars, sodium), STI prevention, and routine healthcare maintenance.  2. Screening for metabolic disorder - Routine screening.  - Hepatic Function Panel  3. Screening for deficiency anemia - Routine screening.  - CBC  4. Diabetes mellitus screening - Routine screening.  - Hemoglobin A1c  5. Screening cholesterol level - Routine screening.  - Lipid panel  6. Thyroid disorder screen - Routine screening.  - TSH  7. Encounter for screening mammogram for malignant neoplasm of breast - Routine screening.  - MM Digital Screening; Future  8. Abdominal pain, unspecified abdominal location - Routine screening.  - POCT URINALYSIS DIP (CLINITEK); Future - POCT urine pregnancy - Cervicovaginal ancillary only  9. Encounter for screening for HIV - Routine screening.  - HIV antibody (with reflex)  10. Need for hepatitis C screening test - Routine screening.  - Hepatitis C Antibody  11. Language barrier - Patient is accompanied by her daughter who severs as interpreter and part-historian.    Patient was given the opportunity to ask questions.  Patient verbalized understanding of the plan and was able to repeat key elements of the plan. Patient was given clear instructions to go to Emergency Department or return to medical center if symptoms don't improve, worsen, or new problems develop.The patient verbalized understanding.   Orders Placed This Encounter  Procedures   MM Digital Screening   Hepatic Function Panel   Lipid panel   CBC   Hemoglobin A1c   TSH   Hepatitis C Antibody   HIV antibody (with reflex)   POCT URINALYSIS DIP (CLINITEK)   POCT urine pregnancy    Return in  about 1 year (around 11/22/2023) for Physical per patient preference.  [name], NP

## 2022-11-23 ENCOUNTER — Other Ambulatory Visit: Payer: Self-pay | Admitting: Family

## 2022-11-23 DIAGNOSIS — Z13220 Encounter for screening for lipoid disorders: Secondary | ICD-10-CM

## 2022-11-23 LAB — HEPATIC FUNCTION PANEL
ALT: 20 [IU]/L (ref 0–32)
AST: 15 [IU]/L (ref 0–40)
Albumin: 4 g/dL (ref 3.9–4.9)
Alkaline Phosphatase: 89 [IU]/L (ref 44–121)
Bilirubin Total: <0.2 mg/dL (ref 0.0–1.2)
Bilirubin, Direct: <0.08 mg/dL (ref 0.00–0.40)
Total Protein: 7.3 g/dL (ref 6.0–8.5)

## 2022-11-23 LAB — CERVICOVAGINAL ANCILLARY ONLY
Bacterial Vaginitis (gardnerella): POSITIVE — AB
Candida Glabrata: NEGATIVE
Candida Vaginitis: NEGATIVE
Chlamydia: NEGATIVE
Comment: NEGATIVE
Comment: NEGATIVE
Comment: NEGATIVE
Comment: NEGATIVE
Comment: NEGATIVE
Comment: NORMAL
Neisseria Gonorrhea: NEGATIVE
Trichomonas: NEGATIVE

## 2022-11-23 LAB — HEMOGLOBIN A1C
Est. average glucose Bld gHb Est-mCnc: 120 mg/dL
Hgb A1c MFr Bld: 5.8 % — ABNORMAL HIGH (ref 4.8–5.6)

## 2022-11-23 LAB — CBC
Hematocrit: 43.4 % (ref 34.0–46.6)
Hemoglobin: 14.4 g/dL (ref 11.1–15.9)
MCH: 31 pg (ref 26.6–33.0)
MCHC: 33.2 g/dL (ref 31.5–35.7)
MCV: 94 fL (ref 79–97)
Platelets: 347 10*3/uL (ref 150–450)
RBC: 4.64 x10E6/uL (ref 3.77–5.28)
RDW: 11.9 % (ref 11.7–15.4)
WBC: 6.3 10*3/uL (ref 3.4–10.8)

## 2022-11-23 LAB — TSH: TSH: 3.24 u[IU]/mL (ref 0.450–4.500)

## 2022-11-23 LAB — LIPID PANEL
Chol/HDL Ratio: 3.7 ratio (ref 0.0–4.4)
Cholesterol, Total: 194 mg/dL (ref 100–199)
HDL: 52 mg/dL (ref >39)
LDL Chol Calc (NIH): 111 mg/dL — ABNORMAL HIGH (ref 0–99)
Triglycerides: 179 mg/dL — ABNORMAL HIGH (ref 0–149)
VLDL Cholesterol Cal: 31 mg/dL (ref 5–40)

## 2022-11-23 LAB — HEPATITIS C ANTIBODY: Hep C Virus Ab: NONREACTIVE

## 2022-11-23 LAB — HIV ANTIBODY (ROUTINE TESTING W REFLEX): HIV Screen 4th Generation wRfx: NONREACTIVE

## 2022-11-24 ENCOUNTER — Other Ambulatory Visit: Payer: Self-pay | Admitting: Family

## 2022-11-24 DIAGNOSIS — N760 Acute vaginitis: Secondary | ICD-10-CM

## 2022-11-24 MED ORDER — METRONIDAZOLE 500 MG PO TABS: 500.0000 mg | ORAL_TABLET | Freq: Two times a day (BID) | ORAL | 0 refills | Status: AC

## 2022-11-24 NOTE — Telephone Encounter (Signed)
See lab result note.

## 2022-12-10 ENCOUNTER — Other Ambulatory Visit: Payer: Self-pay | Admitting: Family

## 2022-12-10 DIAGNOSIS — Z3041 Encounter for surveillance of contraceptive pills: Secondary | ICD-10-CM

## 2022-12-11 NOTE — Telephone Encounter (Signed)
Patient established with Gynecology. Request refills from the same. Also, patient's last contraception management prescription was for Desogestrel-Ethinyl Estradiol tablets and not requested Enskyce tablets. Please let me know if I can further assist. Thank you.

## 2022-12-13 NOTE — Telephone Encounter (Signed)
Patient established with Gynecology. Request refills from the same. Also, patient's last contraception management prescription was for Desogestrel-Ethinyl Estradiol tablets and not requested Enskyce tablets. Please let me know if I can further assist. Thank you.

## 2022-12-14 NOTE — Telephone Encounter (Signed)
Enskyce prescribed 12/13/2022.

## 2023-01-09 ENCOUNTER — Other Ambulatory Visit: Payer: Self-pay | Admitting: Family

## 2023-01-09 DIAGNOSIS — Z3041 Encounter for surveillance of contraceptive pills: Secondary | ICD-10-CM

## 2023-01-12 NOTE — Telephone Encounter (Signed)
 Complete

## 2023-03-11 ENCOUNTER — Other Ambulatory Visit: Payer: Self-pay | Admitting: Family

## 2023-03-11 DIAGNOSIS — Z3041 Encounter for surveillance of contraceptive pills: Secondary | ICD-10-CM

## 2023-04-09 ENCOUNTER — Other Ambulatory Visit: Payer: Self-pay | Admitting: Family

## 2023-04-09 DIAGNOSIS — Z3041 Encounter for surveillance of contraceptive pills: Secondary | ICD-10-CM

## 2023-04-09 NOTE — Telephone Encounter (Signed)
 Complete

## 2023-07-07 ENCOUNTER — Other Ambulatory Visit: Payer: Self-pay | Admitting: Family

## 2023-07-07 DIAGNOSIS — Z3041 Encounter for surveillance of contraceptive pills: Secondary | ICD-10-CM

## 2023-08-10 ENCOUNTER — Other Ambulatory Visit: Payer: Self-pay | Admitting: Family

## 2023-08-10 DIAGNOSIS — Z3041 Encounter for surveillance of contraceptive pills: Secondary | ICD-10-CM

## 2023-09-09 ENCOUNTER — Other Ambulatory Visit: Payer: Self-pay | Admitting: Family

## 2023-09-09 DIAGNOSIS — Z3041 Encounter for surveillance of contraceptive pills: Secondary | ICD-10-CM

## 2023-09-11 NOTE — Telephone Encounter (Signed)
 Requested Prescriptions  Pending Prescriptions Disp Refills   desogestrel -ethinyl estradiol  (ENSKYCE ) 0.15-30 MG-MCG tablet [Pharmacy Med Name: ENSKYCE  0.15 MG-30 MCG TAB] 28 tablet 2    Sig: Take 1 tablet by mouth once daily     OB/GYN:  Contraceptives Passed - 09/11/2023 12:12 PM      Passed - Last BP in normal range    BP Readings from Last 1 Encounters:  11/22/22 120/70         Passed - Valid encounter within last 12 months    Recent Outpatient Visits           9 months ago Annual physical exam   [facility], Amy J, NP   12 months ago Encounter to establish care   [facility], Amy J, NP   4 years ago Encounter for annual physical exam   [facility] [place]. [facility] Theotis Haze ORN, NP   4 years ago Encounter for Papanicolaou smear for cervical cancer screening   [facility] [place]. [facility] Theotis Haze ORN, NP   4 years ago Pelvic pain in female    [facility] [place]. [facility], Jon HERO, [place]       Future Appointments             In 2 months Lorren Greig PARAS, NP [facility] at [facility], [address]            Passed - Patient is not a smoker

## 2023-11-23 ENCOUNTER — Ambulatory Visit: Payer: Self-pay | Admitting: Family

## 2023-11-23 ENCOUNTER — Encounter: Payer: Self-pay | Admitting: Family

## 2023-11-23 VITALS — BP 128/86 | HR 60 | Temp 98.6°F | Resp 16 | Ht 63.0 in | Wt 197.6 lb

## 2023-11-23 DIAGNOSIS — Z130 Encounter for screening for diseases of the blood and blood-forming organs and certain disorders involving the immune mechanism: Secondary | ICD-10-CM

## 2023-11-23 DIAGNOSIS — R233 Spontaneous ecchymoses: Secondary | ICD-10-CM

## 2023-11-23 DIAGNOSIS — Z131 Encounter for screening for diabetes mellitus: Secondary | ICD-10-CM

## 2023-11-23 DIAGNOSIS — Z13220 Encounter for screening for lipoid disorders: Secondary | ICD-10-CM

## 2023-11-23 DIAGNOSIS — Z124 Encounter for screening for malignant neoplasm of cervix: Secondary | ICD-10-CM

## 2023-11-23 DIAGNOSIS — Z1329 Encounter for screening for other suspected endocrine disorder: Secondary | ICD-10-CM

## 2023-11-23 DIAGNOSIS — Z0000 Encounter for general adult medical examination without abnormal findings: Secondary | ICD-10-CM

## 2023-11-23 DIAGNOSIS — Z603 Acculturation difficulty: Secondary | ICD-10-CM

## 2023-11-23 DIAGNOSIS — Z13228 Encounter for screening for other metabolic disorders: Secondary | ICD-10-CM

## 2023-11-23 NOTE — Progress Notes (Signed)
 Patient ID: Deanna Robles, female    DOB: [birth year]  MRN: [id number]  CC: Annual Exam  Subjective: Deanna Robles is a 43 y.o. female who presents for annual exam.   Her concerns today include:  - Up to date on mammogram per [facility]. - Due for cervical cancer screening.  - Bruising of left arm and legs. Denies recent trauma/injury and red flag symptoms.  - States she does not have any sleep issues.  Patient Active Problem List   Diagnosis Date Noted   BV (bacterial vaginosis) 09/15/2022   Visit for routine gyn exam 08/19/2020   Dysmenorrhea 05/01/2019   Pelvic pain 03/14/2019   Uterine fibroid 03/14/2019   Depression 11/25/2013   Routine general medical examination at a health care facility 10/11/2012   GERD (gastroesophageal reflux disease) 10/11/2012   Dental caries 08/08/2012   Chronic dental pain 08/08/2012     Current Outpatient Medications on File Prior to Visit  Medication Sig Dispense Refill   aspirin 500 MG tablet Take 500 mg by mouth every 6 (six) hours as needed for pain. (Patient not taking: Reported on 11/22/2022)     atorvastatin  (LIPITOR) 10 MG tablet Take 1 tablet (10 mg total) by mouth daily. (Patient not taking: Reported on 03/09/2022) 90 tablet 3   desogestrel -ethinyl estradiol  (ENSKYCE ) 0.15-30 MG-MCG tablet Take 1 tablet by mouth once daily 28 tablet 2   diphenhydrAMINE  (BENADRYL  ALLERGY) 25 mg capsule Take 1 capsule (25 mg total) by mouth every 8 (eight) hours as needed. (Patient not taking: Reported on 11/22/2022) 30 capsule 1   EPINEPHrine  0.3 mg/0.3 mL IJ SOAJ injection Inject 0.3 mg into the muscle as needed for anaphylaxis. 2 each 1   naproxen  (NAPROSYN ) 500 MG tablet Take 1 tablet (500 mg total) by mouth 2 (two) times daily with a meal. 60 tablet 1   Vitamin D , Ergocalciferol , (DRISDOL ) 1.25 MG (50000 UNIT) CAPS capsule Take 1 capsule (50,000 Units total) by mouth every 7 (seven) days. (Patient not taking: Reported on  03/09/2022) 12 capsule 1   No current facility-administered medications on file prior to visit.    Allergies  Allergen Reactions   Fruit Extracts Itching    States only watermelon and cucumber only fruits that she is okay to eat.     Social History   Socioeconomic History   Marital status: Married    Spouse name: Not on file   Number of children: 2   Years of education: Not on file   Highest education level: 9th grade  Occupational History   Not on file  Tobacco Use   Smoking status: Never   Smokeless tobacco: Never  Vaping Use   Vaping status: Never Used  Substance and Sexual Activity   Alcohol use: No    Comment: occasionally   Drug use: No   Sexual activity: Yes    Birth control/protection: Pill  Other Topics Concern   Not on file  Social History Narrative   Not on file   Social Drivers of Health   Financial Resource Strain: Not on file  Food Insecurity: No Food Insecurity (03/09/2022)   Hunger Vital Sign    Worried About Running Out of Food in the Last Year: Never true    Ran Out of Food in the Last Year: Never true  Transportation Needs: No Transportation Needs (03/09/2022)   PRAPARE - Administrator, Civil Service (Medical): No    Lack of Transportation (Non-Medical): No  Physical Activity:  Inactive (11/22/2022)   Exercise Vital Sign    Days of Exercise per Week: 0 days    Minutes of Exercise per Session: 0 min  Stress: No Stress Concern Present (11/22/2022)   [employer] of Occupational Health - Occupational Stress Questionnaire    Feeling of Stress : Not at all  Social Connections: Not on file  Intimate Partner Violence: Not on file    Family History  Problem Relation Age of Onset   Hypertension Mother    Diabetes Mother    Hyperlipidemia Mother    Diabetes Brother    Hyperlipidemia Brother    Fibroids Sister     Past Surgical History:  Procedure Laterality Date   Tooth Molar Extraction      ROS: Review of  Systems Negative except as stated above  PHYSICAL EXAM: BP 128/86   Pulse 60   Temp 98.6 F (37 C) (Oral)   Resp 16   Ht 5' 3 (1.6 m)   Wt 197 lb 9.6 oz (89.6 kg)   LMP 11/09/2023 (Approximate)   SpO2 94%   BMI 35.00 kg/m   Physical Exam HENT:     Head: Normocephalic and atraumatic.     Right Ear: Tympanic membrane, ear canal and external ear normal.     Left Ear: Tympanic membrane, ear canal and external ear normal.     Nose: Nose normal.     Mouth/Throat:     Mouth: Mucous membranes are moist.     Pharynx: Oropharynx is clear.  Eyes:     Extraocular Movements: Extraocular movements intact.     Conjunctiva/sclera: Conjunctivae normal.     Pupils: Pupils are equal, round, and reactive to light.  Neck:     Thyroid: No thyroid mass, thyromegaly or thyroid tenderness.  Cardiovascular:     Rate and Rhythm: Normal rate and regular rhythm.     Pulses: Normal pulses.     Heart sounds: Normal heart sounds.  Pulmonary:     Effort: Pulmonary effort is normal.     Breath sounds: Normal breath sounds.  Chest:     Comments: Patient declined. Abdominal:     General: Bowel sounds are normal.     Palpations: Abdomen is soft.  Genitourinary:    Comments: Patient declined. Musculoskeletal:        General: Normal range of motion.     Right shoulder: Normal.     Left shoulder: Normal.     Right upper arm: Normal.     Left upper arm: Normal.     Right elbow: Normal.     Left elbow: Normal.     Right forearm: Normal.     Left forearm: Normal.     Right wrist: Normal.     Left wrist: Normal.     Right hand: Normal.     Left hand: Normal.     Cervical back: Normal, normal range of motion and neck supple.     Thoracic back: Normal.     Lumbar back: Normal.     Right hip: Normal.     Left hip: Normal.     Right upper leg: Normal.     Left upper leg: Normal.     Right knee: Normal.     Left knee: Normal.     Right lower leg: Normal.     Left lower leg: Normal.     Right  ankle: Normal.     Left ankle: Normal.     Right foot: Normal.  Left foot: Normal.  Skin:    General: Skin is warm and dry.     Capillary Refill: Capillary refill takes less than 2 seconds.     Comments: Minimal bruising.  Neurological:     General: No focal deficit present.     Mental Status: She is alert and oriented to person, place, and time.  Psychiatric:        Mood and Affect: Mood normal.        Behavior: Behavior normal.    ASSESSMENT AND PLAN: 1. Annual physical exam (Primary) - Counseled on 150 minutes of exercise per week as tolerated, healthy eating (including decreased daily intake of saturated fats, cholesterol, added sugars, sodium), STI prevention, and routine healthcare maintenance.  2. Screening for metabolic disorder - Routine screening.  - CMP14+EGFR  3. Screening for deficiency anemia - Routine screening.  - CBC  4. Diabetes mellitus screening - Routine screening.  - Hemoglobin A1c  5. Screening cholesterol level - Routine screening.  - Lipid panel  6. Thyroid disorder screen - Routine screening.  - TSH  7. Cervical cancer screening - Referral to Gynecology for evaluation/management. - Ambulatory referral to Gynecology  8. Easy bruising - Referral to Dermatology for evaluation/management. - Ambulatory referral to Dermatology  9. Language barrier - [employer]. ID#: [id number]    Patient was given the opportunity to ask questions.  Patient verbalized understanding of the plan and was able to repeat key elements of the plan. Patient was given clear instructions to go to Emergency Department or return to medical center if symptoms don't improve, worsen, or new problems develop.The patient verbalized understanding.   Orders Placed This Encounter  Procedures   CBC   Lipid panel   CMP14+EGFR   Hemoglobin A1c   TSH   Ambulatory referral to Gynecology   Ambulatory referral to Dermatology     Return in about 1 year (around  11/22/2024) for Physical per patient preference.  Greig JINNY Chute, NP

## 2023-11-23 NOTE — Progress Notes (Signed)
 Patient has had bruising on her arms and legs, patient has not been sleeping well

## 2023-11-24 LAB — CMP14+EGFR
ALT: 38 IU/L — ABNORMAL HIGH (ref 0–32)
AST: 23 IU/L (ref 0–40)
Albumin: 4.4 g/dL (ref 3.9–4.9)
Alkaline Phosphatase: 93 IU/L (ref 41–116)
BUN/Creatinine Ratio: 15 (ref 9–23)
BUN: 9 mg/dL (ref 6–24)
Bilirubin Total: <0.2 mg/dL (ref 0.0–1.2)
CO2: 21 mmol/L (ref 20–29)
Calcium: 8.7 mg/dL (ref 8.7–10.2)
Chloride: 102 mmol/L (ref 96–106)
Creatinine, Ser: 0.61 mg/dL (ref 0.57–1.00)
Globulin, Total: 3.1 g/dL (ref 1.5–4.5)
Glucose: 85 mg/dL (ref 70–99)
Potassium: 3.5 mmol/L (ref 3.5–5.2)
Sodium: 138 mmol/L (ref 134–144)
Total Protein: 7.5 g/dL (ref 6.0–8.5)
eGFR: 114 mL/min/1.73 (ref >59)

## 2023-11-24 LAB — LIPID PANEL
Chol/HDL Ratio: 4.1 ratio (ref 0.0–4.4)
Cholesterol, Total: 196 mg/dL (ref 100–199)
HDL: 48 mg/dL (ref >39)
LDL Chol Calc (NIH): 106 mg/dL — ABNORMAL HIGH (ref 0–99)
Triglycerides: 244 mg/dL — ABNORMAL HIGH (ref 0–149)
VLDL Cholesterol Cal: 42 mg/dL — ABNORMAL HIGH (ref 5–40)

## 2023-11-24 LAB — CBC
Hematocrit: 43.3 % (ref 34.0–46.6)
Hemoglobin: 14.3 g/dL (ref 11.1–15.9)
MCH: 31.1 pg (ref 26.6–33.0)
MCHC: 33 g/dL (ref 31.5–35.7)
MCV: 94 fL (ref 79–97)
Platelets: 321 x10E3/uL (ref 150–450)
RBC: 4.6 x10E6/uL (ref 3.77–5.28)
RDW: 12 % (ref 11.7–15.4)
WBC: 5.7 x10E3/uL (ref 3.4–10.8)

## 2023-11-24 LAB — TSH: TSH: 1.88 u[IU]/mL (ref 0.450–4.500)

## 2023-11-24 LAB — HEMOGLOBIN A1C
Est. average glucose Bld gHb Est-mCnc: 117 mg/dL
Hgb A1c MFr Bld: 5.7 % — ABNORMAL HIGH (ref 4.8–5.6)

## 2023-11-26 ENCOUNTER — Ambulatory Visit: Payer: Self-pay | Admitting: Family

## 2023-11-26 DIAGNOSIS — Z13228 Encounter for screening for other metabolic disorders: Secondary | ICD-10-CM

## 2023-11-26 DIAGNOSIS — E785 Hyperlipidemia, unspecified: Secondary | ICD-10-CM

## 2023-11-26 MED ORDER — ATORVASTATIN CALCIUM 20 MG PO TABS: 20.0000 mg | ORAL_TABLET | Freq: Every day | ORAL | 0 refills | Status: AC

## 2023-11-30 ENCOUNTER — Other Ambulatory Visit: Payer: Self-pay | Admitting: Family

## 2023-11-30 DIAGNOSIS — Z3041 Encounter for surveillance of contraceptive pills: Secondary | ICD-10-CM

## 2023-11-30 NOTE — Telephone Encounter (Signed)
 Pt scheduled

## 2023-11-30 NOTE — Telephone Encounter (Signed)
 Schedule lab only appointment for urine pregnancy. Will review result and prescribe at that time if appropriate.

## 2023-12-10 ENCOUNTER — Telehealth: Payer: Self-pay | Admitting: Family

## 2023-12-10 ENCOUNTER — Other Ambulatory Visit: Payer: Self-pay | Admitting: Family

## 2023-12-10 DIAGNOSIS — Z3041 Encounter for surveillance of contraceptive pills: Secondary | ICD-10-CM

## 2023-12-10 NOTE — Telephone Encounter (Unsigned)
 Copied from CRM #[id number]. Topic: General - Other >> Dec 10, 2023 11:44 AM Eva FALCON wrote: Reason for CRM: I tried to schedule patients lab since I saw the encounter where it was requested, but pt did not want to schedule states she just had her physical 11/14, and is not trying to be billed for more things, states she is currently not working and does not have the money to be paying. Requesting a call back from a spanish speaking person for anything other questions or concerns.

## 2023-12-10 NOTE — Telephone Encounter (Signed)
 Copied from CRM #[id number]. Topic: Clinical - Medication Refill >> Dec 10, 2023 11:38 AM Eva FALCON wrote: Medication: desogestrel -ethinyl estradiol  (ENSKYCE ) 0.15-30 MG-MCG tablet  Has the patient contacted their pharmacy? Yes, states pharmacy did not want to give her more refills.  (Agent: If no, request that the patient contact the pharmacy for the refill. If patient does not wish to contact the pharmacy document the reason why and proceed with request.) (Agent: If yes, when and what did the pharmacy advise?)  This is the patient's preferred pharmacy:  [facility] [address] ([address]), [place] - [address] [place] ([place]) [place] [address] Phone: [phone] Fax: [phone]  Is this the correct pharmacy for this prescription? Yes If no, delete pharmacy and type the correct one.   Has the prescription been filled recently? Yes  Is the patient out of the medication? Yes  Has the patient been seen for an appointment in the last year OR does the patient have an upcoming appointment? Yes  Can we respond through MyChart? No  Agent: Please be advised that Rx refills may take up to 3 business days. We ask that you follow-up with your pharmacy.

## 2023-12-11 ENCOUNTER — Other Ambulatory Visit: Payer: Self-pay | Admitting: Family

## 2023-12-11 DIAGNOSIS — Z3041 Encounter for surveillance of contraceptive pills: Secondary | ICD-10-CM

## 2023-12-11 NOTE — Telephone Encounter (Signed)
 Last urine pregnancy test 11/22/2022. Will need an updated urine pregnancy test. Schedule lab only appointment for collection.

## 2023-12-11 NOTE — Telephone Encounter (Signed)
 Pt scheduled

## 2023-12-13 NOTE — Telephone Encounter (Signed)
 Requested medication (s) are due for refill today - yes  Requested medication (s) are on the active medication list -yes  Future visit scheduled -yes  Last refill: 09/11/23  Notes to clinic: OV 11/14, Rx denied x 2 -per note needs UPT  Requested Prescriptions  Pending Prescriptions Disp Refills   desogestrel -ethinyl estradiol  (ENSKYCE ) 0.15-30 MG-MCG tablet 28 tablet 2    Sig: Take 1 tablet by mouth daily.     OB/GYN:  Contraceptives Passed - 12/13/2023  1:34 PM      Passed - Last BP in normal range    BP Readings from Last 1 Encounters:  11/23/23 128/86         Passed - Valid encounter within last 12 months    Recent Outpatient Visits           2 weeks ago Annual physical exam   [facility] [facility], [place], [place]   1 year ago Annual physical exam   [place] [facility], [place], [place]   1 year ago Encounter to establish care   [facility], [place], NP   4 years ago Encounter for annual physical exam   [facility] [place]. [facility] Theotis Haze ORN, NP   4 years ago Encounter for Papanicolaou smear for cervical cancer screening   [facility] [place] [facility] [place]. [facility] Theotis Haze ORN, [place]              Passed - Patient is not a smoker         Requested Prescriptions  Pending Prescriptions Disp Refills   desogestrel -ethinyl estradiol  (ENSKYCE ) 0.15-30 MG-MCG tablet 28 tablet 2    Sig: Take 1 tablet by mouth daily.     OB/GYN:  Contraceptives Passed - 12/13/2023  1:34 PM      Passed - Last BP in normal range    BP Readings from Last 1 Encounters:  11/23/23 128/86         Passed - Valid encounter within last 12 months    Recent Outpatient Visits           2 weeks ago Annual physical exam   [place] [facility], [place], [place]   1 year ago Annual physical exam    [place] [facility], [place], [place]   1 year ago Encounter to establish care   [facility], [place], NP   4 years ago Encounter for annual physical exam    [facility] [place]. [facility] Theotis Haze ORN, NP   4 years ago Encounter for Papanicolaou smear for cervical cancer screening    [facility] [place] [facility] [place]. [facility] Theotis Haze ORN, [place]              Passed - Patient is not a smoker

## 2024-01-14 ENCOUNTER — Telehealth: Payer: Self-pay | Admitting: Family

## 2024-01-14 ENCOUNTER — Encounter: Payer: Self-pay | Admitting: Family

## 2024-01-14 NOTE — Telephone Encounter (Signed)
 Called pt to reschedule missed appt; could not reach or leave vm

## 2024-01-14 NOTE — Progress Notes (Signed)
 Erroneous encounter-disregard

## 2024-11-24 ENCOUNTER — Ambulatory Visit: Payer: Self-pay | Admitting: Family
